# Patient Record
Sex: Female | Born: 1980 | Race: White | Hispanic: Yes | Marital: Single | Smoking: Never smoker
Health system: Southern US, Community
[De-identification: ages and names within clinical notes are randomized; demographics above are authoritative.]

## PROBLEM LIST (undated history)

## (undated) DIAGNOSIS — E039 Hypothyroidism, unspecified: Secondary | ICD-10-CM

## (undated) HISTORY — PX: NO PAST SURGERIES: SHX2092

---

## 2006-06-10 ENCOUNTER — Inpatient Hospital Stay (HOSPITAL_COMMUNITY): Admission: AD | Admit: 2006-06-10 | Discharge: 2006-06-10 | Payer: Self-pay | Admitting: Obstetrics & Gynecology

## 2006-06-10 IMAGING — US US OB TRANSVAGINAL MODIFY
1 series · 14 of 28 positions shown · non-contrast
Comparison: none

OBSTETRICAL ULTRASOUND:

 This ultrasound exam was performed in the [HOSPITAL] Ultrasound Department.  The OB US report was generated in the AS system, and faxed to the ordering physician.  This report is also available in [REDACTED] PACS.

[Series 1: us ob comp less 14 wks · 14 of 50 slices shown]
[im 2/50]
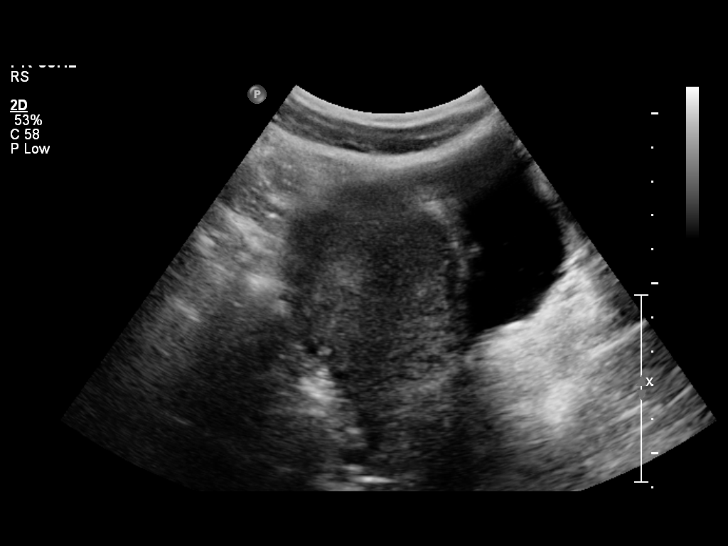
[im 6/50]
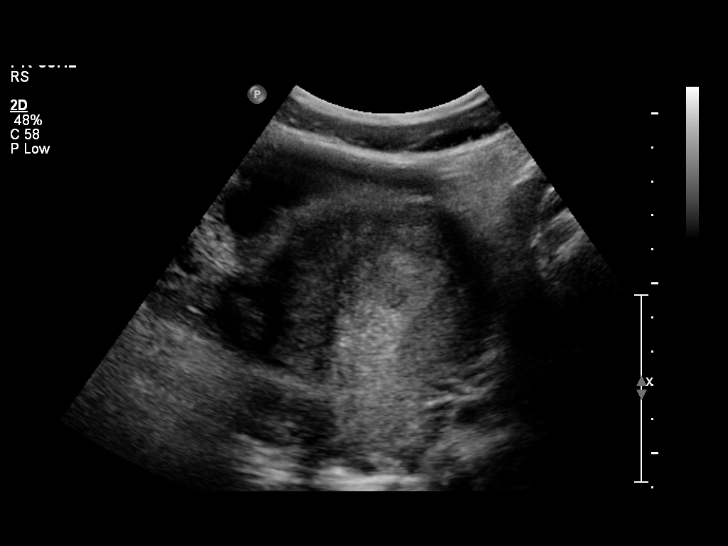
[im 10/50]
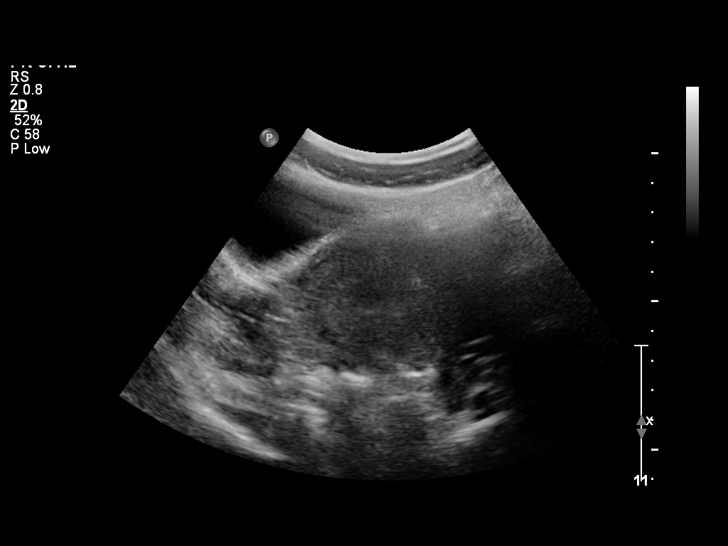
[im 13/50]
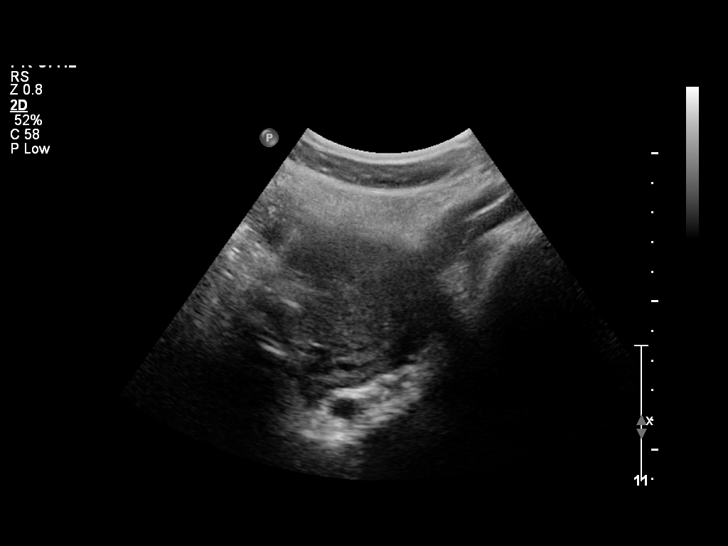
[im 17/50]
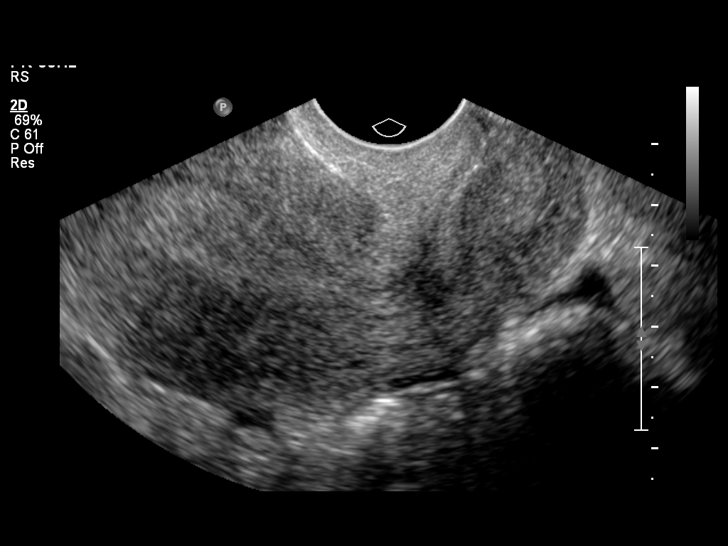
[im 20/50]
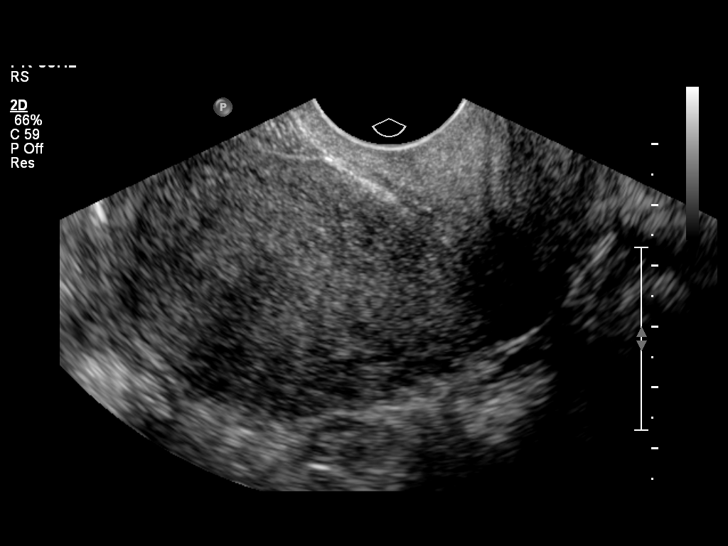
[im 24/50]
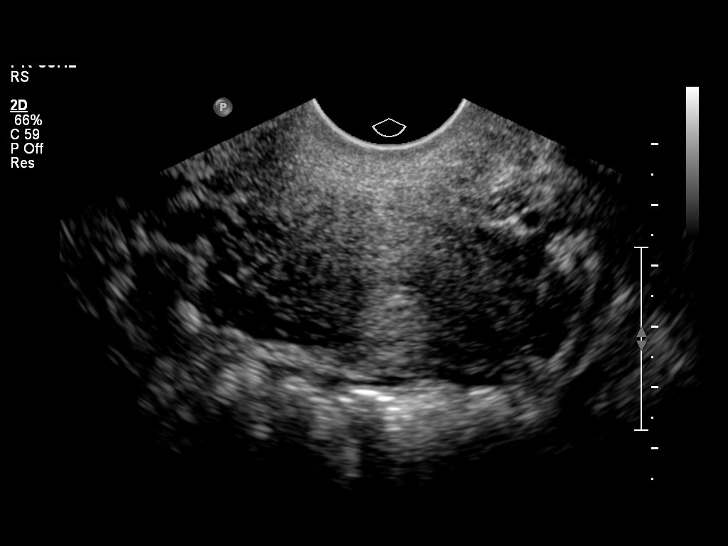
[im 28/50]
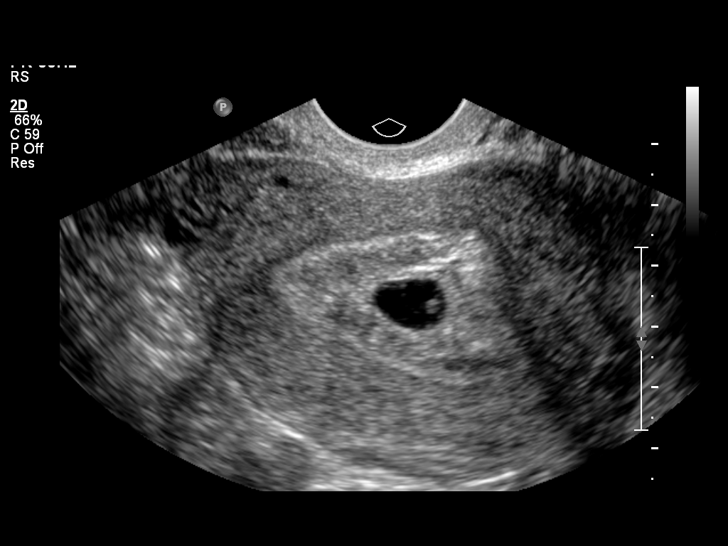
[im 31/50]
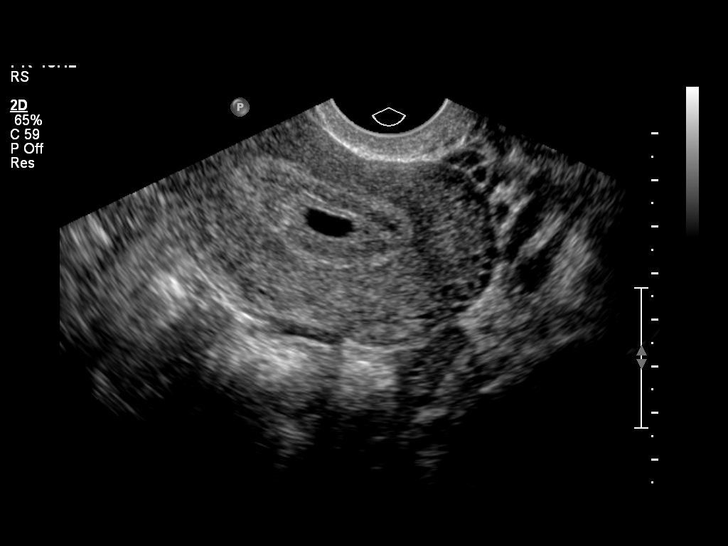
[im 35/50]
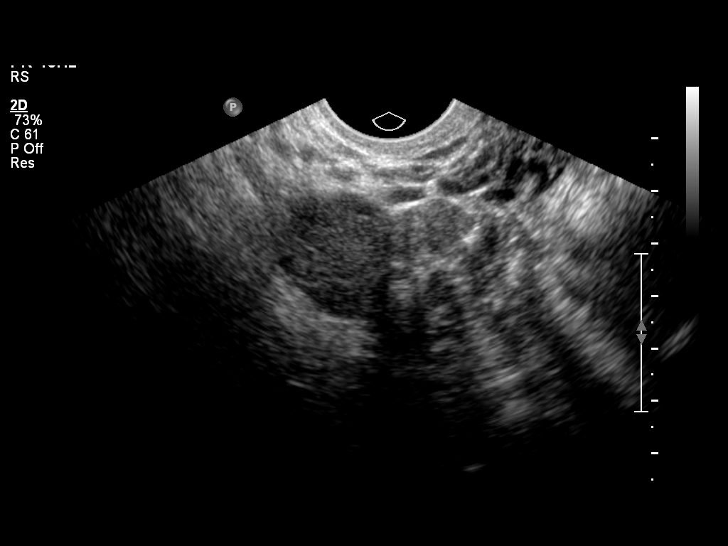
[im 39/50]
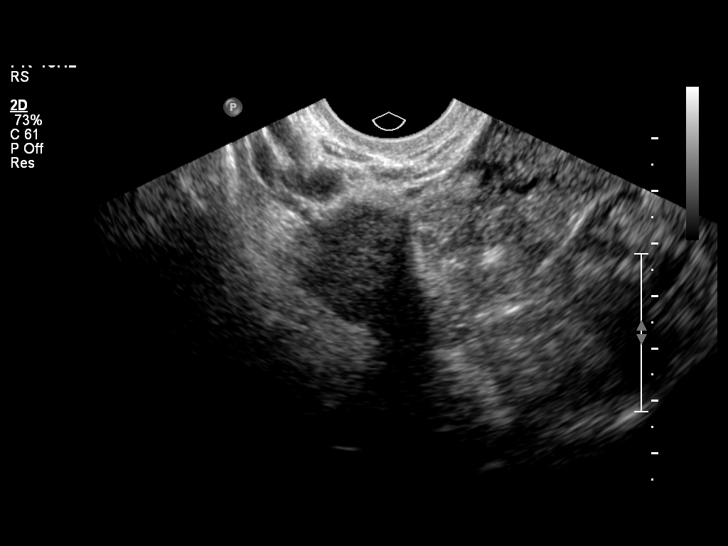
[im 42/50]
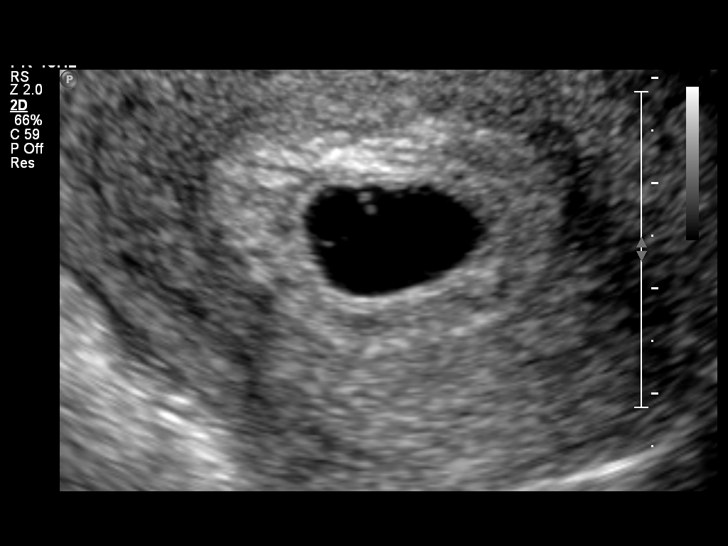
[im 46/50]
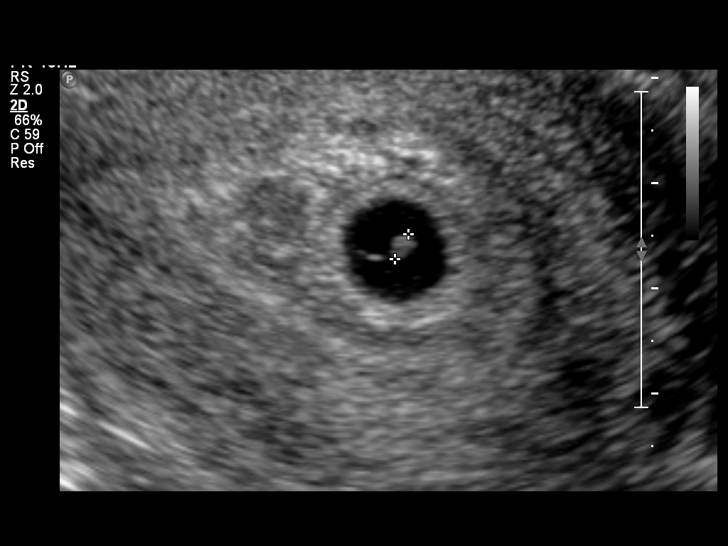
[im 50/50]
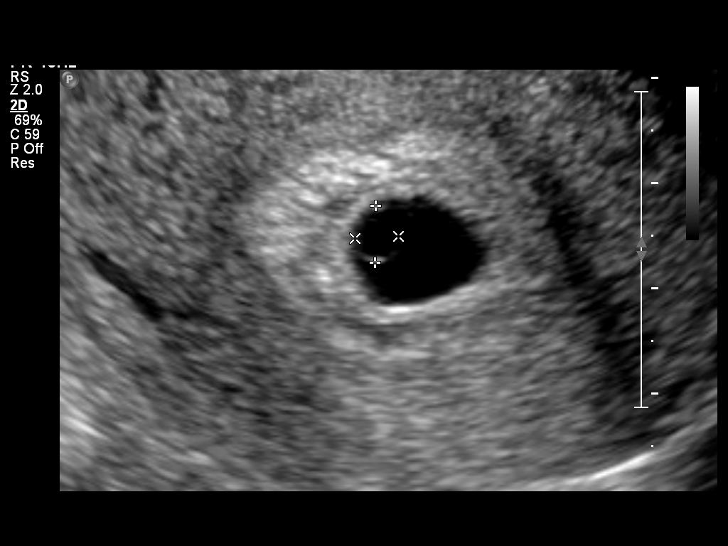

[14 of 28 positions shown; findings below may reference images not displayed]

IMPRESSION: See AS Obstetric US report.

## 2006-06-19 ENCOUNTER — Inpatient Hospital Stay (HOSPITAL_COMMUNITY): Admission: RE | Admit: 2006-06-19 | Discharge: 2006-06-19 | Payer: Self-pay

## 2006-06-19 IMAGING — US US OB TRANSVAGINAL
1 series · 14 of 26 positions shown · non-contrast
Comparison: none

OBSTETRICAL ULTRASOUND:

 This ultrasound exam was performed in the [HOSPITAL] Ultrasound Department.  The OB US report was generated in the AS system, and faxed to the ordering physician.  This report is also available in [REDACTED] PACS.

[Series 1: us ob transvaginal · 0.13mm/px · 14 of 26 slices shown]
[im 1/26]
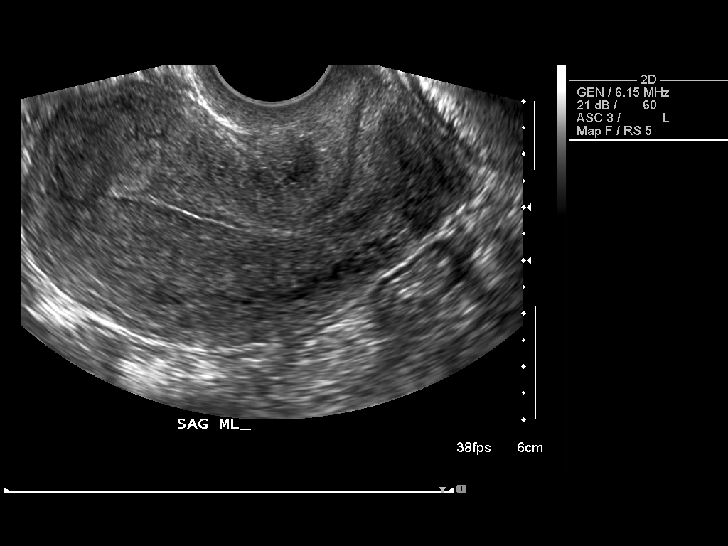
[im 3/26]
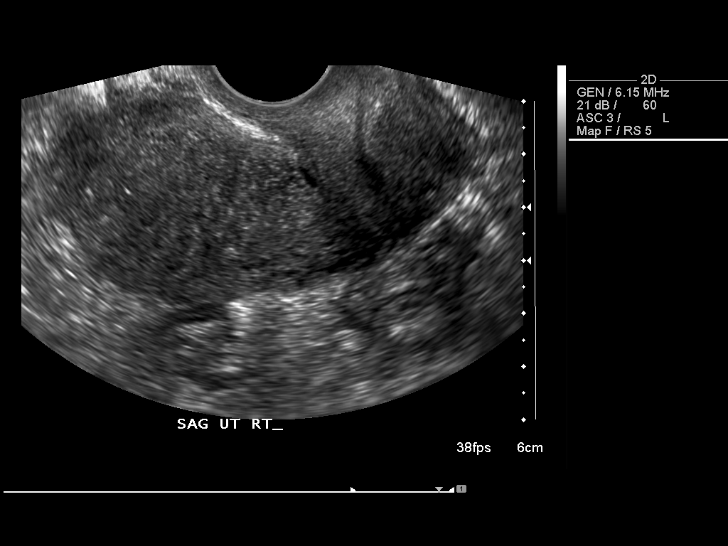
[im 5/26]
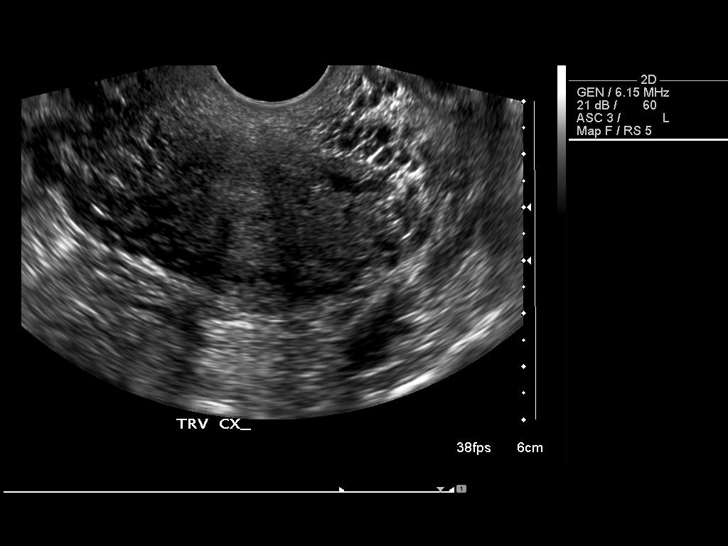
[im 7/26]
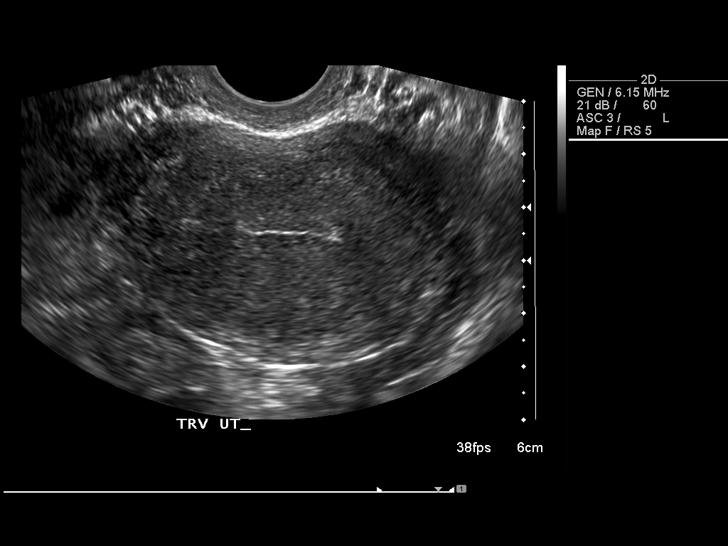
[im 9/26]
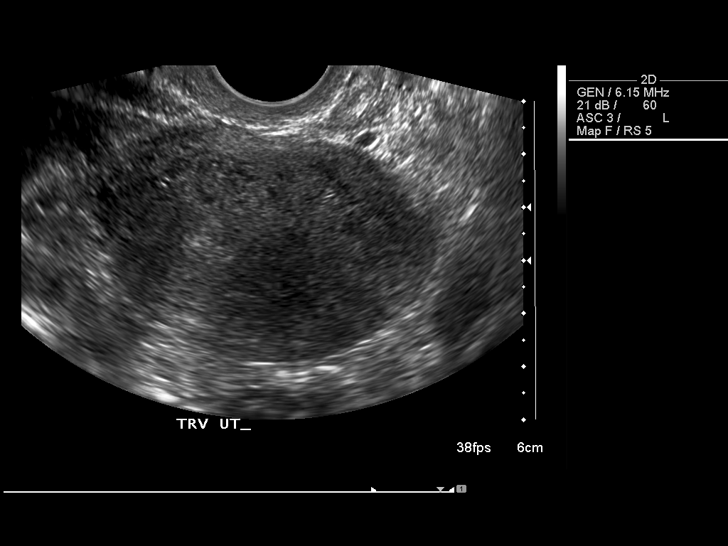
[im 11/26]
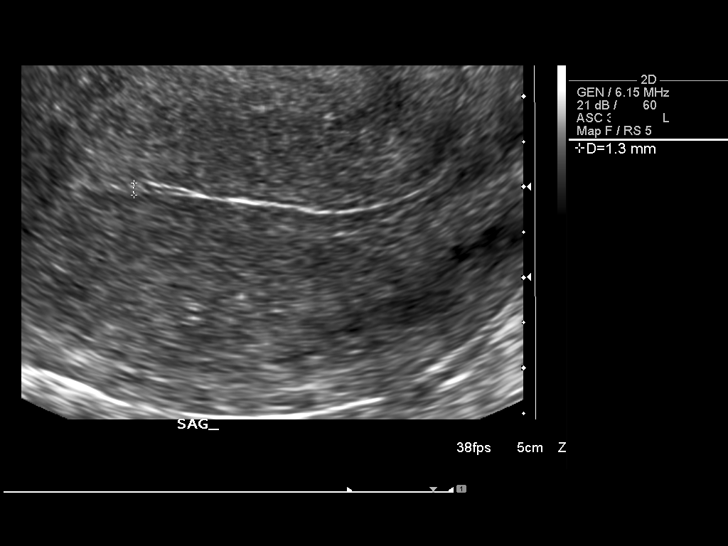
[im 13/26]
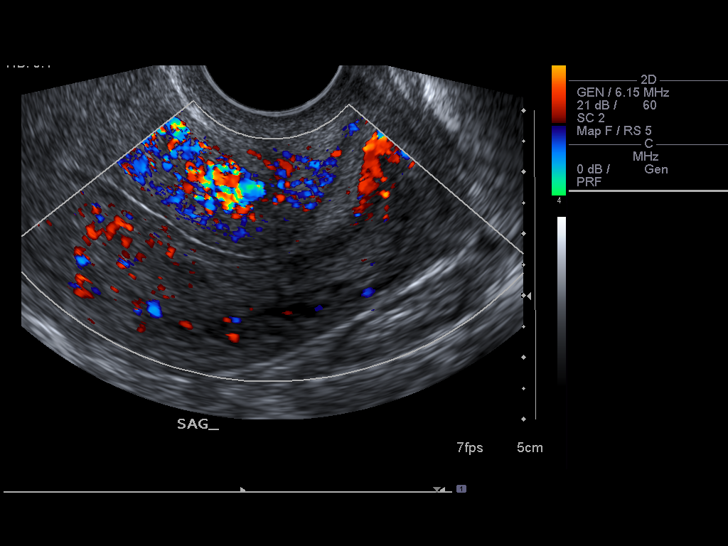
[im 14/26]
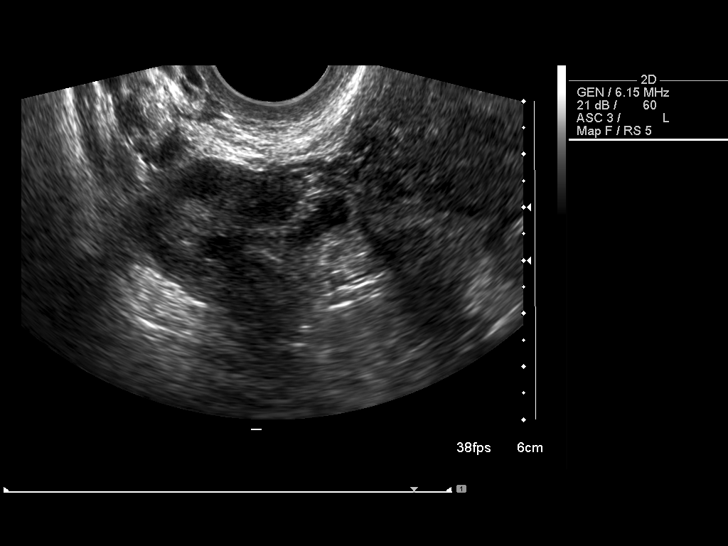
[im 16/26]
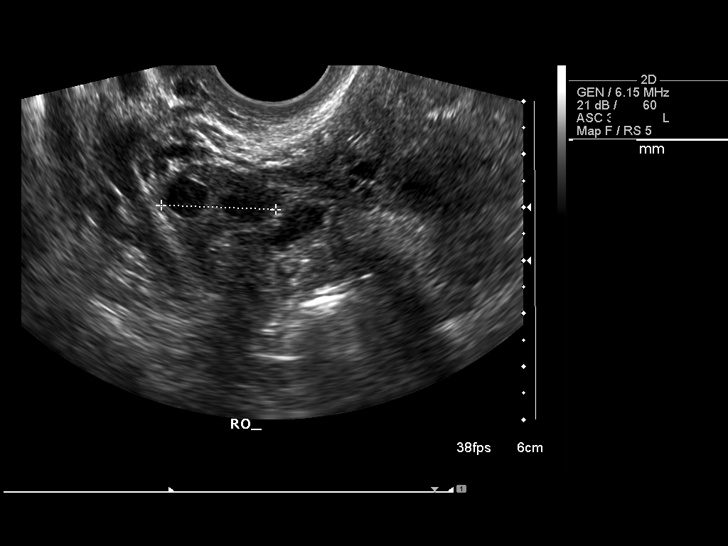
[im 18/26]
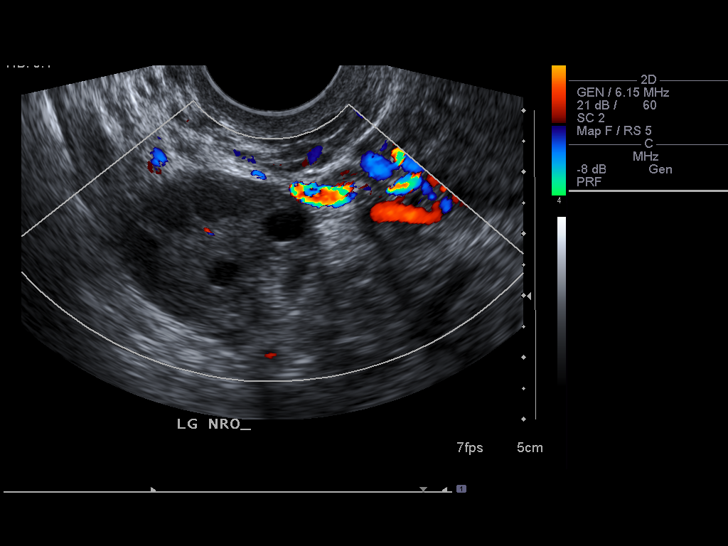
[im 20/26]
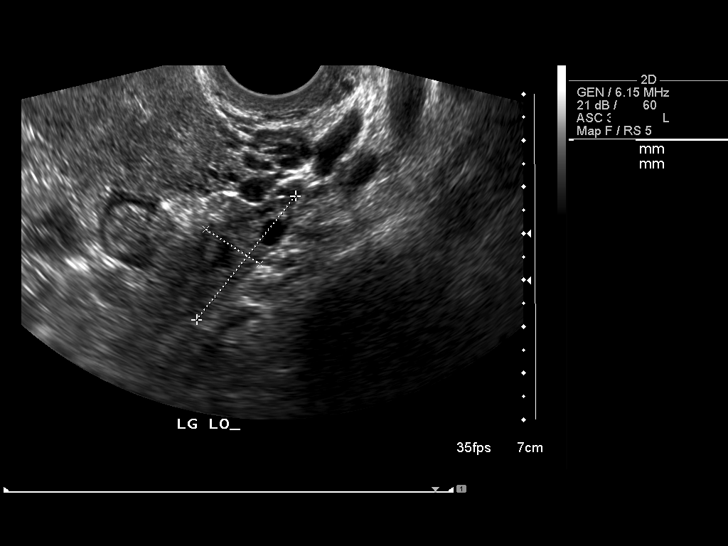
[im 22/26]
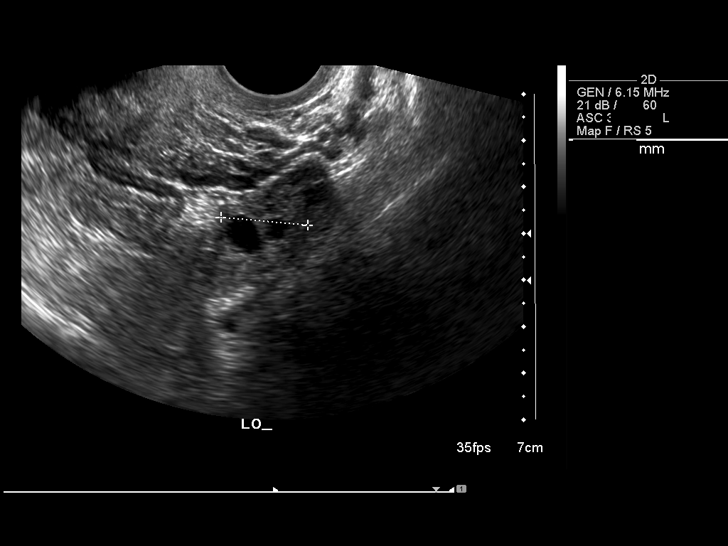
[im 24/26]
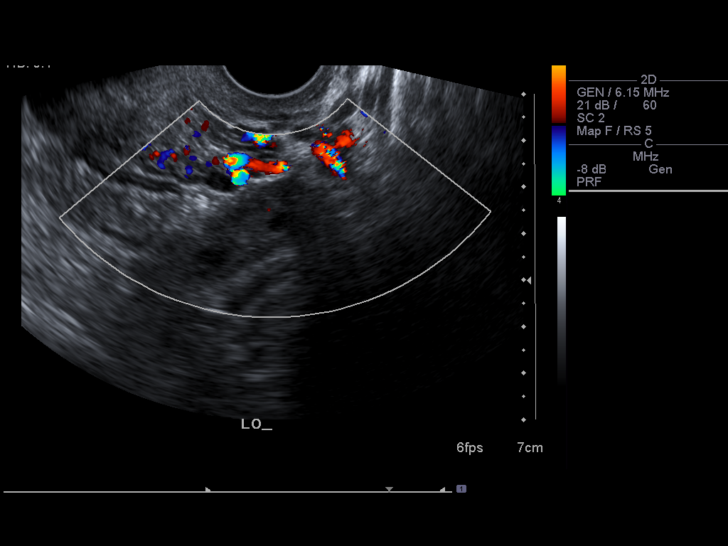
[im 26/26]
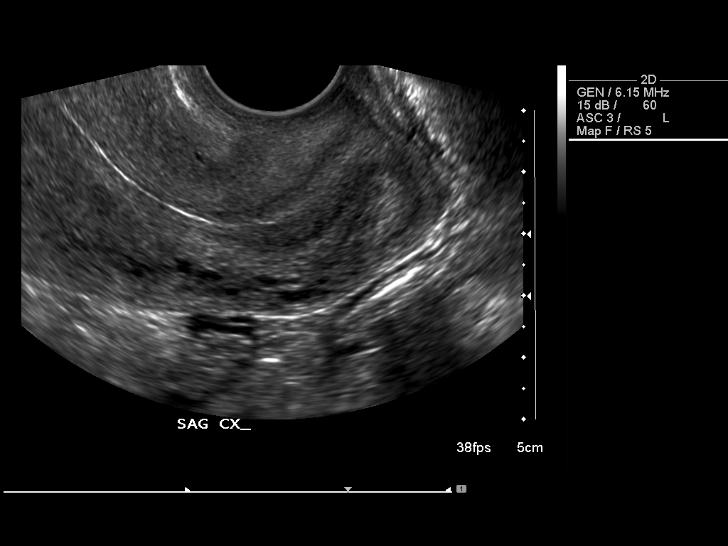

[14 of 26 positions shown; findings below may reference images not displayed]

IMPRESSION: See AS Obstetric US report.

## 2006-11-25 ENCOUNTER — Other Ambulatory Visit: Admission: RE | Admit: 2006-11-25 | Discharge: 2006-11-25 | Payer: Self-pay | Admitting: Family Medicine

## 2007-09-23 ENCOUNTER — Inpatient Hospital Stay (HOSPITAL_COMMUNITY): Admission: AD | Admit: 2007-09-23 | Discharge: 2007-09-24 | Payer: Self-pay | Admitting: Obstetrics and Gynecology

## 2010-12-27 LAB — CBC
Hemoglobin: 11.7 — ABNORMAL LOW
MCHC: 34.1
RBC: 3.53 — ABNORMAL LOW
WBC: 15.9 — ABNORMAL HIGH

## 2010-12-27 LAB — RPR: RPR Ser Ql: NONREACTIVE

## 2021-02-14 LAB — OB RESULTS CONSOLE RPR: RPR: NONREACTIVE

## 2021-02-14 LAB — OB RESULTS CONSOLE HIV ANTIBODY (ROUTINE TESTING): HIV: NONREACTIVE

## 2021-02-14 LAB — OB RESULTS CONSOLE HEPATITIS B SURFACE ANTIGEN: Hepatitis B Surface Ag: NEGATIVE

## 2021-02-14 LAB — HEPATITIS C ANTIBODY: HCV Ab: NEGATIVE

## 2021-04-01 NOTE — L&D Delivery Note (Signed)
Delivery Note At 1010 a viable female infant was delivered via SVD, presentation: LOA. APGAR: 9, 9; weight pending.   Placenta status: spontaneously delivered intact with gentle cord traction. Fundus firm with massage and Pitocin.   Anesthesia: local Lacerations: 2nd degree perineal Suture used for repair: 2-0 Vicryl rapide Est. Blood Loss (mL): 100 Placenta to home with mother Complications none Cord ph n/a   Mom to postpartum. Baby to Couplet care / Skin to Skin.    Julianne Handler, CNM 08/30/2021 10:47 AM

## 2021-08-30 ENCOUNTER — Encounter (HOSPITAL_COMMUNITY): Payer: Self-pay

## 2021-08-30 ENCOUNTER — Inpatient Hospital Stay (HOSPITAL_COMMUNITY)
Admission: AD | Admit: 2021-08-30 | Discharge: 2021-08-31 | DRG: 807 | Disposition: A | Discharge status: Home / Self Care | Attending: Obstetrics and Gynecology | Admitting: Obstetrics and Gynecology

## 2021-08-30 DIAGNOSIS — E039 Hypothyroidism, unspecified: Secondary | ICD-10-CM | POA: Diagnosis present

## 2021-08-30 DIAGNOSIS — O4292 Full-term premature rupture of membranes, unspecified as to length of time between rupture and onset of labor: Principal | ICD-10-CM | POA: Diagnosis present

## 2021-08-30 DIAGNOSIS — O99284 Endocrine, nutritional and metabolic diseases complicating childbirth: Secondary | ICD-10-CM | POA: Diagnosis present

## 2021-08-30 DIAGNOSIS — O26893 Other specified pregnancy related conditions, third trimester: Secondary | ICD-10-CM | POA: Diagnosis present

## 2021-08-30 DIAGNOSIS — Z3A38 38 weeks gestation of pregnancy: Secondary | ICD-10-CM

## 2021-08-30 HISTORY — DX: Hypothyroidism, unspecified: E03.9

## 2021-08-30 LAB — CBC
HCT: 36.1 % (ref 36.0–46.0)
Hemoglobin: 12.1 g/dL (ref 12.0–15.0)
MCH: 32.8 pg (ref 26.0–34.0)
MCHC: 33.5 g/dL (ref 30.0–36.0)
MCV: 97.8 fL (ref 80.0–100.0)
Platelets: 137 10*3/uL — ABNORMAL LOW (ref 150–400)
RBC: 3.69 MIL/uL — ABNORMAL LOW (ref 3.87–5.11)
RDW: 14.6 % (ref 11.5–15.5)
WBC: 11.6 10*3/uL — ABNORMAL HIGH (ref 4.0–10.5)
nRBC: 0 % (ref 0.0–0.2)

## 2021-08-30 LAB — TYPE AND SCREEN
ABO/RH(D): A POS
Antibody Screen: NEGATIVE

## 2021-08-30 LAB — POCT FERN TEST: POCT Fern Test: POSITIVE

## 2021-08-30 LAB — RPR: RPR Ser Ql: NONREACTIVE

## 2021-08-30 LAB — TSH: TSH: 2.19 u[IU]/mL (ref 0.350–4.500)

## 2021-08-30 LAB — OB RESULTS CONSOLE RUBELLA ANTIBODY, IGM: Rubella: IMMUNE

## 2021-08-30 MED ORDER — LACTATED RINGERS IV SOLN: INTRAVENOUS | Status: DC

## 2021-08-30 MED ORDER — IBUPROFEN 600 MG PO TABS
600.0000 mg | ORAL_TABLET | Freq: Four times a day (QID) | ORAL | Status: DC
  Administered 2021-08-30 – 2021-08-31 (×3): 600 mg via ORAL
  Filled 2021-08-30 (×4): qty 1

## 2021-08-30 MED ORDER — OXYTOCIN-SODIUM CHLORIDE 30-0.9 UT/500ML-% IV SOLN: 2.5000 [IU]/h | INTRAVENOUS | Status: DC

## 2021-08-30 MED ORDER — ONDANSETRON HCL 4 MG/2ML IJ SOLN: 4.0000 mg | INTRAMUSCULAR | Status: DC | PRN

## 2021-08-30 MED ORDER — ACETAMINOPHEN 325 MG PO TABS: 650.0000 mg | ORAL_TABLET | ORAL | Status: DC | PRN

## 2021-08-30 MED ORDER — OXYTOCIN-SODIUM CHLORIDE 30-0.9 UT/500ML-% IV SOLN
1.0000 m[IU]/min | INTRAVENOUS | Status: DC
  Administered 2021-08-30: 2 m[IU]/min via INTRAVENOUS
  Filled 2021-08-30: qty 500

## 2021-08-30 MED ORDER — SENNOSIDES-DOCUSATE SODIUM 8.6-50 MG PO TABS
2.0000 | ORAL_TABLET | ORAL | Status: DC
  Administered 2021-08-30: 2 via ORAL
  Filled 2021-08-30 (×2): qty 2

## 2021-08-30 MED ORDER — PRENATAL MULTIVITAMIN CH
1.0000 | ORAL_TABLET | Freq: Every day | ORAL | Status: DC
  Administered 2021-08-30 – 2021-08-31 (×2): 1 via ORAL
  Filled 2021-08-30 (×2): qty 1

## 2021-08-30 MED ORDER — LEVOTHYROXINE SODIUM 75 MCG PO TABS
125.0000 ug | ORAL_TABLET | Freq: Every day | ORAL | Status: DC
  Administered 2021-08-30: 125 ug via ORAL
  Filled 2021-08-30: qty 1

## 2021-08-30 MED ORDER — COCONUT OIL OIL: 1.0000 "application " | TOPICAL_OIL | Status: DC | PRN

## 2021-08-30 MED ORDER — FENTANYL CITRATE (PF) 100 MCG/2ML IJ SOLN: 50.0000 ug | INTRAMUSCULAR | Status: DC | PRN

## 2021-08-30 MED ORDER — DIPHENHYDRAMINE HCL 25 MG PO CAPS: 25.0000 mg | ORAL_CAPSULE | Freq: Four times a day (QID) | ORAL | Status: DC | PRN

## 2021-08-30 MED ORDER — ONDANSETRON HCL 4 MG/2ML IJ SOLN: 4.0000 mg | Freq: Four times a day (QID) | INTRAMUSCULAR | Status: DC | PRN

## 2021-08-30 MED ORDER — SOD CITRATE-CITRIC ACID 500-334 MG/5ML PO SOLN: 30.0000 mL | ORAL | Status: DC | PRN

## 2021-08-30 MED ORDER — BENZOCAINE-MENTHOL 20-0.5 % EX AERO
1.0000 "application " | INHALATION_SPRAY | CUTANEOUS | Status: DC | PRN
  Administered 2021-08-30: 1 via TOPICAL
  Filled 2021-08-30: qty 56

## 2021-08-30 MED ORDER — TETANUS-DIPHTH-ACELL PERTUSSIS 5-2.5-18.5 LF-MCG/0.5 IM SUSY: 0.5000 mL | PREFILLED_SYRINGE | Freq: Once | INTRAMUSCULAR | Status: DC

## 2021-08-30 MED ORDER — WITCH HAZEL-GLYCERIN EX PADS: 1.0000 "application " | MEDICATED_PAD | CUTANEOUS | Status: DC | PRN

## 2021-08-30 MED ORDER — ONDANSETRON HCL 4 MG PO TABS: 4.0000 mg | ORAL_TABLET | ORAL | Status: DC | PRN

## 2021-08-30 MED ORDER — OXYTOCIN BOLUS FROM INFUSION
333.0000 mL | Freq: Once | INTRAVENOUS | Status: AC
  Administered 2021-08-30: 333 mL via INTRAVENOUS

## 2021-08-30 MED ORDER — LEVOTHYROXINE SODIUM 100 MCG PO TABS
100.0000 ug | ORAL_TABLET | Freq: Every day | ORAL | Status: DC
  Administered 2021-08-31: 100 ug via ORAL
  Filled 2021-08-30: qty 1

## 2021-08-30 MED ORDER — OXYCODONE-ACETAMINOPHEN 5-325 MG PO TABS: 1.0000 | ORAL_TABLET | ORAL | Status: DC | PRN

## 2021-08-30 MED ORDER — LACTATED RINGERS IV SOLN: 500.0000 mL | INTRAVENOUS | Status: DC | PRN

## 2021-08-30 MED ORDER — OXYCODONE-ACETAMINOPHEN 5-325 MG PO TABS: 2.0000 | ORAL_TABLET | ORAL | Status: DC | PRN

## 2021-08-30 MED ORDER — MEASLES, MUMPS & RUBELLA VAC IJ SOLR: 0.5000 mL | Freq: Once | INTRAMUSCULAR | Status: DC

## 2021-08-30 MED ORDER — SIMETHICONE 80 MG PO CHEW: 80.0000 mg | CHEWABLE_TABLET | ORAL | Status: DC | PRN

## 2021-08-30 MED ORDER — DIBUCAINE (PERIANAL) 1 % EX OINT: 1.0000 "application " | TOPICAL_OINTMENT | CUTANEOUS | Status: DC | PRN

## 2021-08-30 MED ORDER — LIDOCAINE HCL (PF) 1 % IJ SOLN
30.0000 mL | INTRAMUSCULAR | Status: AC | PRN
  Administered 2021-08-30: 30 mL via SUBCUTANEOUS
  Filled 2021-08-30: qty 30

## 2021-08-30 MED ORDER — TRANEXAMIC ACID-NACL 1000-0.7 MG/100ML-% IV SOLN
1000.0000 mg | INTRAVENOUS | Status: AC
  Administered 2021-08-30: 1000 mg via INTRAVENOUS
  Filled 2021-08-30: qty 100

## 2021-08-30 MED ORDER — TERBUTALINE SULFATE 1 MG/ML IJ SOLN: 0.2500 mg | Freq: Once | INTRAMUSCULAR | Status: DC | PRN

## 2021-08-30 NOTE — Lactation Note (Signed)
This note was copied from a baby's chart. Lactation Consultation Note  Patient Name: Girl Alexee Delsanto DPOEU'M Date: 08/30/2021 Reason for consult: Initial assessment;Early term 37-38.6wks;Maternal endocrine disorder Age:40 hours P4, ETI female infant. Per mom, infant had 3 voids and one stool since birth. Infant has breastfeed 3 times since delivery for 15 to 20 minutes, 1049 am, 1500 pm, 1750 pm . Mom is experienced at breast feeding see maternal data below and mom had Sonata Medela DEBP at home.  Mom feels breastfeeding is going well she has no questions or concerns for LC at this time. LC did not observe latch at this time, per mom, infant BF at 1740 for 20 minutes, infant was not cuing to feed and mom was changing stool diaper while LC was in the room. Mom will continue to breastfeed infant according to hunger cues, on demand, 8 to 12 times within 24 hours, skin to skin. Mom will continue to latch infant on both breast during a feeding, if infant is still cuing to breastfeed.  Mom knows to call RN/LC if she needs latch assistance. Mom made aware of O/P services, breastfeeding support groups, community resources, and our phone # for post-discharge questions.    Maternal Data Has patient been taught Hand Expression?: Yes Does the patient have breastfeeding experience prior to this delivery?: Yes How long did the patient breastfeed?: Per mom, she BF st child for 3 months and 2nd& 3rd child for 8 months each, her 3rd child is now 71 years old.  Feeding Mother's Current Feeding Choice: Breast Milk  LATCH Score                    Lactation Tools Discussed/Used    Interventions Interventions: Breast feeding basics reviewed;Skin to skin;Hand express;Position options;DEBP;Education;LC Services brochure  Discharge Pump: Personal (Per mom, she has Sonata Medela DEBP at home.)  Consult Status Consult Status: Follow-up Date: 08/31/21 Follow-up type: In-patient    Danelle Earthly 08/30/2021, 7:10 PM

## 2021-08-30 NOTE — Progress Notes (Signed)
Labor Progress Note Marisa Haas is a 41 y.o. [id number] at [redacted]w[redacted]d who presented for SROM.   S: Doing well. Continues to feel intermittent contractions. No concerns.   O:  BP 97/65   Pulse 86   Temp 98.3 F (36.8 C) (Oral)   Resp (!) 22   Ht 5\' 5"  (1.651 m)   Wt 77.6 kg   SpO2 100%   BMI 28.46 kg/m   EFM: Baseline 135 bpm, moderate variability, + accels, no decels Toco: Every 2-5 minutes   CVE: Dilation: 4 Effacement (%): 80 Station: -2 Presentation: Vertex Exam by:: Amily Depp, MD  A&P: 41 y.o. 24 [redacted]w[redacted]d   #Labor: SVE unchanged from prior. Will start Pitocin 2x2 and reassess in 4 hours, sooner as needed.  #Pain: Maternal support; coping well #FWB: Cat 1  #GBS negative  [redacted]w[redacted]d, MD 7:31 AM

## 2021-08-30 NOTE — Progress Notes (Signed)
Labor Progress Note Marisa Haas is a 41 y.o. [id number] at [redacted]w[redacted]d presented for PROM at term  S:  Feeling more uncomfortable with ctx, breathing heavy with each one. Requesting to get in shower.  O:  BP 107/62   Pulse 82   Temp 98.2 F (36.8 C) (Oral)   Resp (!) 22   Ht 5\' 5"  (1.651 m)   Wt 77.6 kg   SpO2 100%   BMI 28.46 kg/m  EFM: baseline 130 bpm/ mod variability/ + accels/ no decels  Toco/IUPC: q2 SVE: Dilation: 7 Effacement (%): 90 Station: -1 Presentation: Vertex Exam by:: [email or web address], CNM Pitocin: 4 mu/min  A/P: 41 y.o. 24 [redacted]w[redacted]d  1. Labor: active 2. FWB: Cat I 3. Pain: analgesia/anesthesia/NO prn   Now in active labor, will d/c Pitocin so pt can get in shower. Anticipate labor progress and SVD.  [redacted]w[redacted]d, CNM 9:33 AM
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
## 2021-08-30 NOTE — Discharge Summary (Signed)
Postpartum Discharge Summary     Patient Name: Marisa Haas DOB: 08/09/1980 MRN: [id number]  Date of admission: 08/30/2021 Delivery date:08/30/2021  Delivering provider: Julianne Handler  Date of discharge: 08/31/2021  Admitting diagnosis: Normal labor [O80, Z37.9] Intrauterine pregnancy: [redacted]w[redacted]d    Secondary diagnosis:  Principal Problem:   Normal labor Active Problems:   SVD (spontaneous vaginal delivery)  Additional problems: AMA, Hypothyroidism    Discharge diagnosis: Term Pregnancy Delivered                                              Post partum procedures: none Augmentation: Pitocin Complications: None  Hospital course: Onset of Labor With Vaginal Delivery      41y.o. yo [id number] 346w3das admitted in Latent Labor on 08/30/2021. Patient had an uncomplicated labor course as follows:  Membrane Rupture Time/Date: 1:30 AM ,08/30/2021   Delivery Method:Vaginal, Spontaneous  Episiotomy: None  Lacerations:  2nd degree  Patient had an uncomplicated postpartum course.  She is ambulating, tolerating a regular diet, passing flatus, and urinating well. Patient is discharged home in stable condition on 08/31/21.  Newborn Data: Birth date:08/30/2021  Birth time:10:10 AM  Gender:Female  Living status:Living  Apgars:9 ,9  Weight:3160 g   Magnesium Sulfate received: No BMZ received: No Rhophylac:N/A MMR:N/A T-DaP: record unavailable Flu: N/A Transfusion:No  Physical exam  Vitals:   08/30/21 1730 08/30/21 2121 08/31/21 0130 08/31/21 0511  BP: 109/66 98/68 (!) 90/56 (!) 98/58  Pulse: 70 76 60 67  Resp: _0 Temp: 98.1 F (36.7 C) 98.2 F (36.8 C) 98.2 F (36.8 C) 98.3 F (36.8 C)  TempSrc: Oral Oral Oral Oral  SpO2: 100% 99% 100% 100%  Weight:      Height:       General: alert, cooperative, and no distress Lochia: appropriate Uterine Fundus: firm Incision: N/A DVT Evaluation: No evidence of DVT seen on physical exam. Labs: Lab Results  Component Value  Date   WBC 11.6 (H) 08/30/2021   HGB 12.1 08/30/2021   HCT 36.1 08/30/2021   MCV 97.8 08/30/2021   PLT 137 (L) 08/30/2021       View : No data to display.         Edinburgh Score:    08/31/2021    7:30 AM  Edinburgh Postnatal Depression Scale Screening Tool  I have been able to laugh and see the funny side of things. 0  I have looked forward with enjoyment to things. 0  I have blamed myself unnecessarily when things went wrong. 1  I have been anxious or worried for no good reason. 0  I have felt scared or panicky for no good reason. 0  Things have been getting on top of me. 1  I have been so unhappy that I have had difficulty sleeping. 0  I have felt sad or miserable. 1  I have been so unhappy that I have been crying. 1  The thought of harming myself has occurred to me. 0  Edinburgh Postnatal Depression Scale Total 4     After visit meds:  Allergies as of 08/31/2021       Reactions   Iodine    Shellfish Allergy         Medication List     TAKE these medications    ferrous sulfate 324 MG  Tbec Take 324 mg by mouth.   ibuprofen 600 MG tablet Commonly known as: ADVIL Take 1 tablet (600 mg total) by mouth every 6 (six) hours.   levothyroxine 100 MCG tablet Commonly known as: SYNTHROID Take 100 mcg by mouth daily before breakfast.   prenatal multivitamin Tabs tablet Take 1 tablet by mouth daily at 12 noon.         Discharge home in stable condition Infant Feeding: Breast Infant Disposition:home with mother Discharge instruction: per After Visit Summary and Postpartum booklet. Activity: Advance as tolerated. Pelvic rest for 6 weeks.  Diet: routine diet Future Appointments:No future appointments. Follow up Visit: [facility] in 6 weeks- pt to schedule    08/31/2021 Hansel Feinstein, CNM

## 2021-08-31 MED ORDER — IBUPROFEN 600 MG PO TABS: 600.0000 mg | ORAL_TABLET | Freq: Four times a day (QID) | ORAL | 0 refills | Status: AC

## 2021-08-31 NOTE — Lactation Note (Signed)
This note was copied from a baby's chart. Lactation Consultation Note  Patient Name: Marisa Haas Date: 08/31/2021 Reason for consult: Follow-up assessment Age:40 hours  P4, Mother denies questions or concerns. Reviewed engorgement care and monitoring voids/stools.  Feeding Mother's Current Feeding Choice: Breast Milk  Interventions Interventions: Education  Discharge Discharge Education: Engorgement and breast care;Warning signs for feeding baby  Consult Status Consult Status: Complete Date: 08/31/21    Dahlia Byes [facility] 08/31/2021, 12:15 PM
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
## 2021-09-10 ENCOUNTER — Telehealth (HOSPITAL_COMMUNITY): Payer: Self-pay

## 2021-09-10 NOTE — Telephone Encounter (Signed)
"  Doing pretty well." Patient declines questions or concerns about her healing.  "She is sleeping well. The doctor was pleased with her being back up to birthweight. Her cord fell off. She sleeps in a bassinet." RN reviewed ABC's of safe sleep with patient. Patient declines any questions or concerns about baby.  EPDS score is 0.  Marcelino Duster [facility] 06/12//2023,1516

## 2022-06-15 ENCOUNTER — Emergency Department (HOSPITAL_COMMUNITY)

## 2022-06-15 ENCOUNTER — Emergency Department (HOSPITAL_COMMUNITY)
Admission: EM | Admit: 2022-06-15 | Discharge: 2022-06-15 | Disposition: A | Discharge status: Home / Self Care | Payer: No Typology Code available for payment source | Attending: Emergency Medicine | Admitting: Emergency Medicine

## 2022-06-15 ENCOUNTER — Other Ambulatory Visit: Payer: Self-pay

## 2022-06-15 ENCOUNTER — Encounter (HOSPITAL_COMMUNITY): Payer: Self-pay | Admitting: Emergency Medicine

## 2022-06-15 DIAGNOSIS — Y92410 Unspecified street and highway as the place of occurrence of the external cause: Secondary | ICD-10-CM | POA: Diagnosis not present

## 2022-06-15 DIAGNOSIS — E039 Hypothyroidism, unspecified: Secondary | ICD-10-CM | POA: Insufficient documentation

## 2022-06-15 DIAGNOSIS — S0003XA Contusion of scalp, initial encounter: Secondary | ICD-10-CM | POA: Insufficient documentation

## 2022-06-15 DIAGNOSIS — R519 Headache, unspecified: Secondary | ICD-10-CM | POA: Diagnosis present
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
## 2022-06-17 ENCOUNTER — Encounter: Payer: Self-pay | Admitting: Physical Medicine and Rehabilitation

## 2022-06-17 ENCOUNTER — Ambulatory Visit (INDEPENDENT_AMBULATORY_CARE_PROVIDER_SITE_OTHER): Payer: Medicaid Other | Admitting: Physical Medicine and Rehabilitation

## 2022-06-17 ENCOUNTER — Other Ambulatory Visit (INDEPENDENT_AMBULATORY_CARE_PROVIDER_SITE_OTHER)

## 2022-06-17 DIAGNOSIS — M7918 Myalgia, other site: Secondary | ICD-10-CM | POA: Diagnosis not present

## 2022-06-17 DIAGNOSIS — M542 Cervicalgia: Secondary | ICD-10-CM

## 2022-06-17 DIAGNOSIS — M25512 Pain in left shoulder: Secondary | ICD-10-CM

## 2022-06-17 DIAGNOSIS — S161XXA Strain of muscle, fascia and tendon at neck level, initial encounter: Secondary | ICD-10-CM | POA: Diagnosis not present

## 2022-06-17 NOTE — Progress Notes (Unsigned)
Functional Pain Scale - descriptive words and definitions  Intense (8)    Cannot complete any ADLs without much assistance/cannot concentrate/conversation is difficult/unable to sleep and unable to use distraction. Severe range order  Average Pain 9  Neck pain on the left side that radiates into the left shoulder. Hard to turn head
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
## 2022-06-20 ENCOUNTER — Other Ambulatory Visit: Payer: Self-pay

## 2022-06-20 ENCOUNTER — Encounter: Payer: Self-pay | Admitting: Physical Therapy

## 2022-06-20 ENCOUNTER — Telehealth: Payer: Self-pay | Admitting: Surgical

## 2022-06-20 ENCOUNTER — Ambulatory Visit: Attending: Physical Medicine and Rehabilitation | Admitting: Physical Therapy

## 2022-06-20 DIAGNOSIS — M25512 Pain in left shoulder: Secondary | ICD-10-CM | POA: Diagnosis not present

## 2022-06-20 DIAGNOSIS — S161XXA Strain of muscle, fascia and tendon at neck level, initial encounter: Secondary | ICD-10-CM | POA: Insufficient documentation

## 2022-06-20 DIAGNOSIS — M6281 Muscle weakness (generalized): Secondary | ICD-10-CM | POA: Insufficient documentation

## 2022-06-20 DIAGNOSIS — M542 Cervicalgia: Secondary | ICD-10-CM | POA: Diagnosis not present

## 2022-06-20 DIAGNOSIS — M7918 Myalgia, other site: Secondary | ICD-10-CM | POA: Insufficient documentation
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
## 2022-06-20 NOTE — Telephone Encounter (Signed)
Pt called requesting a out of work note. Please call when ready for pick up. Pt phone number is [phone].

## 2022-07-01 ENCOUNTER — Other Ambulatory Visit: Payer: Self-pay | Admitting: Physical Medicine and Rehabilitation

## 2022-07-01 ENCOUNTER — Telehealth: Payer: Self-pay | Admitting: Physical Medicine and Rehabilitation

## 2022-07-01 NOTE — Progress Notes (Signed)
Spoke with patient via telephone today, she is requesting note for work. She has attended formal physical therapy recently and has sessions scheduled for the upcoming weeks. I did provide her with work excuse until PT is completed. She has 6 follow up with [place], we will re-evaluate then.

## 2022-07-01 NOTE — Telephone Encounter (Signed)
Patient called in stating she is unable to do her job because of the pain and she would like a call explaining her job duties and see if she can get a note written for her

## 2022-07-03 ENCOUNTER — Ambulatory Visit: Attending: Physical Medicine and Rehabilitation | Admitting: Physical Therapy

## 2022-07-03 ENCOUNTER — Encounter: Payer: Self-pay | Admitting: Physical Therapy

## 2022-07-03 DIAGNOSIS — M542 Cervicalgia: Secondary | ICD-10-CM | POA: Diagnosis present

## 2022-07-03 DIAGNOSIS — M6281 Muscle weakness (generalized): Secondary | ICD-10-CM | POA: Diagnosis present

## 2022-07-03 NOTE — Therapy (Signed)
OUTPATIENT PHYSICAL THERAPY TREATMENT NOTE   Patient Name: Marisa Haas MRN: [id number] DOB:1981/03/10, 42 y.o., female Today's Date: 07/03/2022  PCP: [facility]   REFERRING PROVIDER: Lorine Bears, NP   PT End of Session - 07/03/22 1223     Visit Number 2    Number of Visits --   1-2x/week   Date for PT Re-Evaluation 08/15/22    Authorization Type [employer] MCD - NDI    PT Start Time 1222   pt arrived late   PT Stop Time 1300    PT Time Calculation (min) 38 min             Past Medical History:  Diagnosis Date   Hypothyroidism    Past Surgical History:  Procedure Laterality Date   NO PAST SURGERIES     Patient Active Problem List   Diagnosis Date Noted   Normal labor 08/30/2021   SVD (spontaneous vaginal delivery) 08/30/2021    THERAPY DIAG:  Cervicalgia  Muscle weakness   Rationale for Evaluation and Treatment Rehabilitation  REFERRING DIAG: Cervicalgia [M54.2], Acute pain of left shoulder [M25.512], Myofascial pain syndrome [M79.18], Acute strain of neck muscle, initial encounter [S16.1XXA]   PERTINENT HISTORY: none  PRECAUTIONS/RESTRICTIONS:   none  SUBJECTIVE:  Pt reports that she feels like things are slightly improved since last visit.  She does not feel that the k-tape was helpful, but does feel that TDN made a positive difference.  Pain:  Are you having pain? Yes Pain location: L>R neck pain into L medial scapula and L UT NPRS scale:  10/10 to 10/10 Aggravating factors: rotating to L, neck movements Relieving factors: hot shower does help Pain description: sharp, burning, and aching Stage: Acute Stability: staying the same 24 hour pattern: worse with activity   OBJECTIVE: (objective measures completed at initial evaluation unless otherwise dated)  OBJECTIVE:    DIAGNOSTIC FINDINGS:  CT clear   GENERAL OBSERVATION:          Hesitant to move                               SENSATION:          Light touch: Appears  intact           PALPATION: Exquisite TTP L UT, periscapular, L Cx spine   Cervical ROM   ROM ROM  06/20/2022  Flexion 10  Extension 4  Right lateral flexion 5  Left lateral flexion 3  Right rotation 3  Left rotation 4  Flexion rotation (normal is 30 degrees)    Flexion rotation (normal is 30 degrees)      (Blank rows = not tested, N = WNL, * = concordant pain)   UPPER EXTREMITY MMT:   MMT Right 06/20/2022 Left 06/20/2022  Shoulder flexion WNL Unable to test strength d/t pain  Shoulder abduction (C5)      Shoulder ER      Shoulder IR      Middle trapezius      Lower trapezius      Shoulder extension      Grip strength      Shoulder shrug (C4)      Elbow flexion (C6)      Elbow ext (C7)      Thumb ext (C8)      Finger abd (T1)      Grossly        (Blank rows = not  tested, score listed is out of 5 possible points.  N = WNL, D = diminished, C = clear for gross weakness with myotome testing, * = concordant pain with testing)       PATIENT SURVEYS:  NDI 37/50                             PATIENT EDUCATION:  POC, diagnosis, prognosis, HEP, and outcome measures.  Pt educated via explanation, demonstration, and handout (HEP).  Pt confirms understanding verbally.             HOME EXERCISE PROGRAM: None issued, encouraged heat   Treatment priorities     Eval (06/20/2022)              TDN              Manual for ROM                                                              TREATMENT 4/3:  Manual Therapy: - Manual therapy: Skilled palpation to identify trigger points for TDN STM to all listed muscles following TDN K-tape bil UT and L periscapular region   Trigger Point Dry-Needling  Treatment instructions: Expect mild to moderate muscle soreness. S/S of pneumothorax if dry needled over a lung field, and to seek immediate medical attention should they occur. Patient verbalized understanding of these instructions and education.   Patient Consent Given:  Yes Education handout provided: No Muscles treated: R sub occipital Electrical stimulation performed: No Parameters: N/A Treatment response/outcome: pain reduction    ASSESSMENT:   CLINICAL IMPRESSION: Pt still in high levels of pain and unable to tolerate exercise.  Proceeded with TDN which was better tolerated than last session.  Neck ROM still significantly limited but rotation improved today compared to last visit.     OBJECTIVE IMPAIRMENTS: Pain, neck ROM, UE strength limited d/t pain   ACTIVITY LIMITATIONS: lifting, caring for child, work, housework   PERSONAL FACTORS: See medical history and pertinent history     REHAB POTENTIAL: Good   CLINICAL DECISION MAKING: Stable/uncomplicated   EVALUATION COMPLEXITY: Low     GOALS:     SHORT TERM GOALS: Target date: 07/18/2022   Marisa Haas will be >75% HEP compliant to improve carryover between sessions and facilitate independent management of condition   Evaluation (06/20/2022): ongoing Goal status: INITIAL     LONG TERM GOALS: Target date: 08/15/2022   Marisa Haas will show a >/= 17 pt improvement in their NDI score (MCID 7.5 pts) as a proxy for functional improvement   Evaluation/Baseline (06/20/2022): 37/50 pts Goal status: INITIAL     2.  Marisa Haas will self report >/= 50% decrease in pain from evaluation    Evaluation/Baseline (06/20/2022): 10/10 max pain Goal status: INITIAL     3.  Marisa Haas will be able to care for child including lifting and holding for prolonged perios, not limited by pain   Evaluation/Baseline (06/20/2022): limited Goal status: INITIAL     4.  Marisa Haas will be able to return to work, not limited by pain   Evaluation/Baseline (06/20/2022): limited Goal status: INITIAL     5.  Marisa Haas will improve the following MMTs to >/= 4/5 to show improvement in strength:  shoulder flexion    Evaluation/Baseline (06/20/2022): unable to complete d/t pain Goal status: INITIAL       PLAN: PT FREQUENCY:  1-2x/week   PT DURATION: 8 weeks (Ending 08/15/2022)   PLANNED INTERVENTIONS: Therapeutic exercises, Aquatic therapy, Therapeutic activity, Neuro Muscular re-education, Gait training, Patient/Family education, Joint mobilization, Dry Needling, Electrical stimulation, Spinal mobilization and/or manipulation, Moist heat, Taping, Vasopneumatic device, Ionotophoresis 4mg /ml Dexamethasone, and Manual therapy   Marisa Haas PT 07/03/2022, 1:31 PM

## 2022-07-04 NOTE — Therapy (Signed)
OUTPATIENT PHYSICAL THERAPY TREATMENT NOTE   Patient Name: Marisa Haas MRN: [id number] DOB:12/29/1980, 42 y.o., female Today's Date: 07/05/2022  [facility]   REFERRING PROVIDER: Juanda ChanceWilliams, Megan E, NP   PT End of Session - 07/05/22 1053     Visit Number 3    Date for PT Re-Evaluation 08/15/22    Authorization Type [employer] MCD - NDI    PT Start Time 1050    PT Stop Time 1130    PT Time Calculation (min) 40 min    Activity Tolerance Patient limited by pain    [facility] assessed/performed;Anxious              Past Medical History:  Diagnosis Date   Hypothyroidism    Past Surgical History:  Procedure Laterality Date   NO PAST SURGERIES     Patient Active Problem List   Diagnosis Date Noted   Normal labor 08/30/2021   SVD (spontaneous vaginal delivery) 08/30/2021    THERAPY DIAG:  Cervicalgia  Muscle weakness   Rationale for Evaluation and Treatment Rehabilitation  REFERRING DIAG: Cervicalgia [M54.2], Acute pain of left shoulder [M25.512], Myofascial pain syndrome [M79.18], Acute strain of neck muscle, initial encounter [S16.1XXA]   PERTINENT HISTORY: none  PRECAUTIONS/RESTRICTIONS:   none  SUBJECTIVE: Minimal change in symptoms but reports cervical mobility has slightly improved in rotation.   Pain:  Are you having pain? Yes Pain location: L>R neck pain into L medial scapula and L UT NPRS scale:  10/10 to 10/10 Aggravating factors: rotating to L, neck movements Relieving factors: hot shower does help Pain description: sharp, burning, and aching Stage: Acute Stability: staying the same 24 hour pattern: worse with activity   OBJECTIVE: (objective measures completed at initial evaluation unless otherwise dated)  OBJECTIVE:    DIAGNOSTIC FINDINGS:  CT clear   GENERAL OBSERVATION:          Hesitant to move                               SENSATION:          Light touch: Appears intact            PALPATION: Exquisite TTP L UT, periscapular, L Cx spine   Cervical ROM   ROM ROM  06/20/2022  Flexion 10  Extension 4  Right lateral flexion 5  Left lateral flexion 3  Right rotation 3  Left rotation 4  Flexion rotation (normal is 30 degrees)    Flexion rotation (normal is 30 degrees)      (Blank rows = not tested, N = WNL, * = concordant pain)   UPPER EXTREMITY MMT:   MMT Right 06/20/2022 Left 06/20/2022  Shoulder flexion WNL Unable to test strength d/t pain  Shoulder abduction (C5)      Shoulder ER      Shoulder IR      Middle trapezius      Lower trapezius      Shoulder extension      Grip strength      Shoulder shrug (C4)      Elbow flexion (C6)      Elbow ext (C7)      Thumb ext (C8)      Finger abd (T1)      Grossly        (Blank rows = not tested, score listed is out of 5 possible points.  N =  WNL, D = diminished, C = clear for gross weakness with myotome testing, * = concordant pain with testing)       PATIENT SURVEYS:  NDI 37/50         PATIENT EDUCATION:  POC, diagnosis, prognosis, HEP, and outcome measures.  Pt educated via explanation, demonstration, and handout (HEP).  Pt confirms understanding verbally.             HOME EXERCISE PROGRAM: None issued, encouraged heat   Treatment priorities     Eval (06/20/2022)              TDN              Manual for ROM                                                             [facility] Adult PT Treatment:                                                DATE: 07/05/22  Manual Therapy: Skilled palpation of upper trapezius B to identify taught bands [facility] Tr[facility] Point Dry Needling Treatment: Pre-treatment instruction: Patient instructed on dry needling rationale, procedures, and possible side effects including pain during treatment (achy,cramping feeling), bruising, drop of blood, lightheadedness, nausea, sweating. Patient Consent Given: Yes Education handout provided: Previously provided Muscles  treated: B UT  Needle size and number: .25x8730mm x 2 Electrical stimulation performed: No Parameters: N/A Treatment response/outcome: Twitch response elicited, Palpable decrease in muscle tension, and limited tolerance to procedure due to pain Post-treatment instructions: Patient instructed to expect possible mild to moderate muscle soreness later today and/or tomorrow. Patient instructed in methods to reduce muscle soreness and to continue prescribed HEP. If patient was dry needled over the lung field, patient was instructed on signs and symptoms of pneumothorax and, however unlikely, to see immediate medical attention should they occur. Patient was also educated on signs and symptoms of infection and to seek medical attention should they occur. Patient verbalized understanding of these instructions and education.  Seated myofacial techniques to R first rib, SCMs in supine and B UT's   Self Care: Discussed importance of attempting to increase ROM as well as fashioning a makeshift soft collar from a towel.  If  beneficial, she may benefit from use of a soft collar.  TREATMENT 4/3:  Manual Therapy: - Manual therapy: Skilled palpation to identify trigger points for TDN STM to all listed muscles following TDN K-tape bil UT and L periscapular region   Trigger Point Dry-Needling  Treatment instructions: Expect mild to moderate muscle soreness. S/S of pneumothorax if dry needled over a lung field, and to seek immediate medical attention should they occur. Patient verbalized understanding of these instructions and education.   Patient Consent Given: Yes Education handout provided: No Muscles treated: R sub occipital Electrical stimulation performed: No Parameters: N/A Treatment response/outcome: pain reduction    ASSESSMENT:   CLINICAL IMPRESSION: Continued high levels of pain in upper back and cervical region. Limited tolerance to manual interventions including gentle myofascial techniques,  positional release, sustained pressure to SCM insertions.  Reported diffuse LUE paresthesias periodically.  OBJECTIVE IMPAIRMENTS: Pain, neck ROM, UE strength limited d/t pain   ACTIVITY LIMITATIONS: lifting, caring for child, work, housework   PERSONAL FACTORS: See medical history and pertinent history     REHAB POTENTIAL: Good   CLINICAL DECISION MAKING: Stable/uncomplicated   EVALUATION COMPLEXITY: Low     GOALS:     SHORT TERM GOALS: Target date: 07/18/2022   Shelah will be >75% HEP compliant to improve carryover between sessions and facilitate independent management of condition   Evaluation (06/20/2022): ongoing Goal status: INITIAL     LONG TERM GOALS: Target date: 08/15/2022   Domini will show a >/= 17 pt improvement in their NDI score (MCID 7.5 pts) as a proxy for functional improvement   Evaluation/Baseline (06/20/2022): 37/50 pts Goal status: INITIAL     2.  Louise will self report >/= 50% decrease in pain from evaluation    Evaluation/Baseline (06/20/2022): 10/10 max pain Goal status: INITIAL     3.  Martin will be able to care for child including lifting and holding for prolonged perios, not limited by pain   Evaluation/Baseline (06/20/2022): limited Goal status: INITIAL     4.  Alisi will be able to return to work, not limited by pain   Evaluation/Baseline (06/20/2022): limited Goal status: INITIAL     5.  Amyla will improve the following MMTs to >/= 4/5 to show improvement in strength:  shoulder flexion    Evaluation/Baseline (06/20/2022): unable to complete d/t pain Goal status: INITIAL       PLAN: PT FREQUENCY: 1-2x/week   PT DURATION: 8 weeks (Ending 08/15/2022)   PLANNED INTERVENTIONS: Therapeutic exercises, Aquatic therapy, Therapeutic activity, Neuro Muscular re-education, Gait training, Patient/Family education, Joint mobilization, Dry Needling, Electrical stimulation, Spinal mobilization and/or manipulation, Moist heat, Taping,  Vasopneumatic device, Ionotophoresis 4mg /ml Dexamethasone, and Manual therapy   Hildred Laser PT 07/05/2022, 12:05 PM

## 2022-07-05 ENCOUNTER — Ambulatory Visit

## 2022-07-05 ENCOUNTER — Ambulatory Visit: Admitting: Physical Therapy

## 2022-07-05 DIAGNOSIS — M6281 Muscle weakness (generalized): Secondary | ICD-10-CM

## 2022-07-05 DIAGNOSIS — M542 Cervicalgia: Secondary | ICD-10-CM | POA: Diagnosis not present

## 2022-07-09 ENCOUNTER — Ambulatory Visit: Admitting: Physical Therapy

## 2022-07-11 ENCOUNTER — Ambulatory Visit

## 2022-07-11 DIAGNOSIS — M6281 Muscle weakness (generalized): Secondary | ICD-10-CM

## 2022-07-11 DIAGNOSIS — M542 Cervicalgia: Secondary | ICD-10-CM

## 2022-07-11 NOTE — Therapy (Signed)
OUTPATIENT PHYSICAL THERAPY TREATMENT NOTE   Patient Name: Marisa Haas MRN: [id number] DOB:22-Sep-1980, 42 y.o., female Today's Date: 07/11/2022  [facility]   REFERRING PROVIDER: Juanda Chance, NP   PT End of Session - 07/11/22 1050     Visit Number 4    Date for PT Re-Evaluation 08/15/22    Authorization Type [employer] MCD - NDI    PT Start Time 1050    PT Stop Time 1128    PT Time Calculation (min) 38 min    Activity Tolerance Patient limited by pain    Behavior During Therapy [facility] for tasks assessed/performed;Anxious              Past Medical History:  Diagnosis Date   Hypothyroidism    Past Surgical History:  Procedure Laterality Date   NO PAST SURGERIES     Patient Active Problem List   Diagnosis Date Noted   Normal labor 08/30/2021   SVD (spontaneous vaginal delivery) 08/30/2021    THERAPY DIAG:  Cervicalgia  Muscle weakness   Rationale for Evaluation and Treatment Rehabilitation  REFERRING DIAG: Cervicalgia [M54.2], Acute pain of left shoulder [M25.512], Myofascial pain syndrome [M79.18], Acute strain of neck muscle, initial encounter [S16.1XXA]   PERTINENT HISTORY: none  PRECAUTIONS/RESTRICTIONS:   none  SUBJECTIVE: Pt presents to PT with reports of continued severe L sided neck pain. Has tried to be compliant with HEP With no adverse effect.    Pain:  Are you having pain? Yes Pain location: L>R neck pain into L medial scapula and L UT NPRS scale:  10/10 to 10/10 Aggravating factors: rotating to L, neck movements Relieving factors: hot shower does help Pain description: sharp, burning, and aching Stage: Acute Stability: staying the same 24 hour pattern: worse with activity   OBJECTIVE: (objective measures completed at initial evaluation unless otherwise dated)  OBJECTIVE:    DIAGNOSTIC FINDINGS:  CT clear   GENERAL OBSERVATION:          Hesitant to move                               SENSATION:          Light  touch: Appears intact           PALPATION: Exquisite TTP L UT, periscapular, L Cx spine   Cervical ROM   ROM ROM  06/20/2022  Flexion 10  Extension 4  Right lateral flexion 5  Left lateral flexion 3  Right rotation 3  Left rotation 4  Flexion rotation (normal is 30 degrees)    Flexion rotation (normal is 30 degrees)      (Blank rows = not tested, N = WNL, * = concordant pain)   UPPER EXTREMITY MMT:   MMT Right 06/20/2022 Left 06/20/2022  Shoulder flexion WNL Unable to test strength d/t pain  Shoulder abduction (C5)      Shoulder ER      Shoulder IR      Middle trapezius      Lower trapezius      Shoulder extension      Grip strength      Shoulder shrug (C4)      Elbow flexion (C6)      Elbow ext (C7)      Thumb ext (C8)      Finger abd (T1)      Grossly        (Blank rows = not  tested, score listed is out of 5 possible points.  N = WNL, D = diminished, C = clear for gross weakness with myotome testing, * = concordant pain with testing)       PATIENT SURVEYS:  NDI 37/50         PATIENT EDUCATION:  POC, diagnosis, prognosis, HEP, and outcome measures.  Pt educated via explanation, demonstration, and handout (HEP).  Pt confirms understanding verbally.             HOME EXERCISE PROGRAM: None issued, encouraged heat   Treatment priorities     Eval (06/20/2022)              TDN              Manual for ROM                                                             TREATMENT: [facility] Adult PT Treatment:                                                DATE: 07/11/22 Manual Therapy: Skilled palpation of trigger points in left upper trap STM and positional release ot L upper trap Gentle suboccipital release  Trigger Point Dry Needling Treatment: Pre-treatment instruction: Patient instructed on dry needling rationale, procedures, and possible side effects including pain during treatment (achy,cramping feeling), bruising, drop of blood, lightheadedness, nausea,  sweating. Patient Consent Given: Yes Education handout provided: Previously provided Muscles treated: left upper trap  Needle size and number: .25x5530mm x 1 Electrical stimulation performed: No Parameters: N/A Treatment response/outcome:  Patient did not tolerate well;  Post-treatment instructions: Patient instructed to expect possible mild to moderate muscle soreness later today and/or tomorrow. Patient instructed in methods to reduce muscle soreness and to continue prescribed HEP. If patient was dry needled over the lung field, patient was instructed on signs and symptoms of pneumothorax and, however unlikely, to see immediate medical attention should they occur. Patient was also educated on signs and symptoms of infection and to seek medical attention should they occur. Patient verbalized understanding of these instructions and education.  [facility] Adult PT Treatment:                                                DATE: 07/05/22 Manual Therapy: Skilled palpation of upper trapezius B to identify taught bands [facility] Trigger Point Dry Needling Treatment: Pre-treatment instruction: Patient instructed on dry needling rationale, procedures, and possible side effects including pain during treatment (achy,cramping feeling), bruising, drop of blood, lightheadedness, nausea, sweating. Patient Consent Given: Yes Education handout provided: Previously provided Muscles treated: B UT  Needle size and number: .25x2530mm x 2 Electrical stimulation performed: No Parameters: N/A Treatment response/outcome: Twitch response elicited, Palpable decrease in muscle tension, and limited tolerance to procedure due to pain Post-treatment instructions: Patient instructed to expect possible mild to moderate muscle soreness later today and/or tomorrow. Patient instructed in methods to reduce muscle soreness and to continue prescribed HEP. If patient was  dry needled over the lung field, patient was instructed on signs and  symptoms of pneumothorax and, however unlikely, to see immediate medical attention should they occur. Patient was also educated on signs and symptoms of infection and to seek medical attention should they occur. Patient verbalized understanding of these instructions and education.  Seated myofacial techniques to R first rib, SCMs in supine and B UT's   Self Care: Discussed importance of attempting to increase ROM as well as fashioning a makeshift soft collar from a towel.  If  beneficial, she may benefit from use of a soft collar.  TREATMENT 4/3:  Manual Therapy: - Manual therapy: Skilled palpation to identify trigger points for TDN STM to all listed muscles following TDN K-tape bil UT and L periscapular region   Trigger Point Dry-Needling  Treatment instructions: Expect mild to moderate muscle soreness. S/S of pneumothorax if dry needled over a lung field, and to seek immediate medical attention should they occur. Patient verbalized understanding of these instructions and education.   Patient Consent Given: Yes Education handout provided: No Muscles treated: R sub occipital Electrical stimulation performed: No Parameters: N/A Treatment response/outcome: pain reduction    ASSESSMENT:   CLINICAL IMPRESSION: Pt tolerated treatment fair but continued to be limited by severe L sided neck pain. TPDN was stopped due to pt apprehension, however, she seemed to respond better to [facility] and other manual therapy interventions. She continues to benefit from skilled PT services, will continue to progress manual and DNF exercises as able per POC.    OBJECTIVE IMPAIRMENTS: Pain, neck ROM, UE strength limited d/t pain   ACTIVITY LIMITATIONS: lifting, caring for child, work, housework   PERSONAL FACTORS: See medical history and pertinent history     REHAB POTENTIAL: Good   CLINICAL DECISION MAKING: Stable/uncomplicated   EVALUATION COMPLEXITY: Low     GOALS:     SHORT TERM GOALS: Target  date: 07/18/2022   Marisa Haas will be >75% HEP compliant to improve carryover between sessions and facilitate independent management of condition   Evaluation (06/20/2022): ongoing Goal status: INITIAL     LONG TERM GOALS: Target date: 08/15/2022   Marisa Haas will show a >/= 17 pt improvement in their NDI score (MCID 7.5 pts) as a proxy for functional improvement   Evaluation/Baseline (06/20/2022): 37/50 pts Goal status: INITIAL     2.  Marisa Haas will self report >/= 50% decrease in pain from evaluation    Evaluation/Baseline (06/20/2022): 10/10 max pain Goal status: INITIAL     3.  Marisa Haas will be able to care for child including lifting and holding for prolonged perios, not limited by pain   Evaluation/Baseline (06/20/2022): limited Goal status: INITIAL     4.  Marisa Haas will be able to return to work, not limited by pain   Evaluation/Baseline (06/20/2022): limited Goal status: INITIAL     5.  Marisa Haas will improve the following MMTs to >/= 4/5 to show improvement in strength:  shoulder flexion    Evaluation/Baseline (06/20/2022): unable to complete d/t pain Goal status: INITIAL       PLAN: PT FREQUENCY: 1-2x/week   PT DURATION: 8 weeks (Ending 08/15/2022)   PLANNED INTERVENTIONS: Therapeutic exercises, Aquatic therapy, Therapeutic activity, Neuro Muscular re-education, Gait training, Patient/Family education, Joint mobilization, Dry Needling, Electrical stimulation, Spinal mobilization and/or manipulation, Moist heat, Taping, Vasopneumatic device, Ionotophoresis 4mg /ml Dexamethasone, and Manual therapy   Eloy End PT 07/11/2022, 12:13 PM

## 2022-07-16 ENCOUNTER — Encounter: Payer: Self-pay | Admitting: Physical Therapy

## 2022-07-16 ENCOUNTER — Ambulatory Visit: Admitting: Physical Therapy

## 2022-07-16 DIAGNOSIS — M542 Cervicalgia: Secondary | ICD-10-CM

## 2022-07-16 DIAGNOSIS — M6281 Muscle weakness (generalized): Secondary | ICD-10-CM

## 2022-07-16 NOTE — Therapy (Signed)
OUTPATIENT PHYSICAL THERAPY TREATMENT NOTE   Patient Name: Marisa Haas MRN: [id number] DOB:1980/09/22, 42 y.o., female Today's Date: 07/16/2022  PCP: [facility]   REFERRING PROVIDER: Juanda Chance, NP   PT End of Session - 07/16/22 1308     Visit Number 5    Date for PT Re-Evaluation 08/15/22    Authorization Type [employer] MCD - NDI    PT Start Time 0107    PT Stop Time 0145    PT Time Calculation (min) 38 min    Activity Tolerance Patient limited by pain    Behavior During Therapy [facility] for tasks assessed/performed;Anxious              Past Medical History:  Diagnosis Date   Hypothyroidism    Past Surgical History:  Procedure Laterality Date   NO PAST SURGERIES     Patient Active Problem List   Diagnosis Date Noted   Normal labor 08/30/2021   SVD (spontaneous vaginal delivery) 08/30/2021    THERAPY DIAG:  Cervicalgia  Muscle weakness   Rationale for Evaluation and Treatment Rehabilitation  REFERRING DIAG: Cervicalgia [M54.2], Acute pain of left shoulder [M25.512], Myofascial pain syndrome [M79.18], Acute strain of neck muscle, initial encounter [S16.1XXA]   PERTINENT HISTORY: none  PRECAUTIONS/RESTRICTIONS:   none  SUBJECTIVE: Pt reports that MT last visit was helpful and she feels slightly better.   Pain:  Are you having pain? Yes Pain location: L>R neck pain into L medial scapula and L UT NPRS scale:  8/10 to 10/10 Aggravating factors: rotating to L, neck movements Relieving factors: hot shower does help Pain description: sharp, burning, and aching Stage: Acute Stability: staying the same 24 hour pattern: worse with activity   OBJECTIVE: (objective measures completed at initial evaluation unless otherwise dated)  OBJECTIVE:    DIAGNOSTIC FINDINGS:  CT clear   GENERAL OBSERVATION:          Hesitant to move                               SENSATION:          Light touch: Appears intact           PALPATION: Exquisite  TTP L UT, periscapular, L Cx spine   Cervical ROM   ROM ROM  06/20/2022 4/16  Flexion 10   Extension 4   Right lateral flexion 5   Left lateral flexion 3   Right rotation 3 20  Left rotation 4 15  Flexion rotation (normal is 30 degrees)     Flexion rotation (normal is 30 degrees)       (Blank rows = not tested, N = WNL, * = concordant pain)   UPPER EXTREMITY MMT:   MMT Right 06/20/2022 Left 06/20/2022  Shoulder flexion WNL Unable to test strength d/t pain  Shoulder abduction (C5)      Shoulder ER      Shoulder IR      Middle trapezius      Lower trapezius      Shoulder extension      Grip strength      Shoulder shrug (C4)      Elbow flexion (C6)      Elbow ext (C7)      Thumb ext (C8)      Finger abd (T1)      Grossly        (Blank rows = not tested, score listed  is out of 5 possible points.  N = WNL, D = diminished, C = clear for gross weakness with myotome testing, * = concordant pain with testing)       PATIENT SURVEYS:  NDI 37/50         PATIENT EDUCATION:  POC, diagnosis, prognosis, HEP, and outcome measures.  Pt educated via explanation, demonstration, and handout (HEP).  Pt confirms understanding verbally.             HOME EXERCISE PROGRAM: None issued, encouraged heat   Treatment priorities     Eval (06/20/2022)              TDN              Manual for ROM                                                             TREATMENT: [facility] Adult PT Treatment:                                                DATE: 07/16/22 Manual Therapy: STM and positional release of L and L LS upper trap Gentle suboccipital release    [facility] Adult PT Treatment:                                                DATE: 07/05/22 Manual Therapy: Skilled palpation of upper trapezius B to identify taught bands prior to [facility] Trigger Point Dry Needling Treatment: Pre-treatment instruction: Patient instructed on dry needling rationale, procedures, and possible side effects including  pain during treatment (achy,cramping feeling), bruising, drop of blood, lightheadedness, nausea, sweating. Patient Consent Given: Yes Education handout provided: Previously provided Muscles treated: B UT  Needle size and number: .25x71mm x 2 Electrical stimulation performed: No Parameters: N/A Treatment response/outcome: Twitch response elicited, Palpable decrease in muscle tension, and limited tolerance to procedure due to pain Post-treatment instructions: Patient instructed to expect possible mild to moderate muscle soreness later today and/or tomorrow. Patient instructed in methods to reduce muscle soreness and to continue prescribed HEP. If patient was dry needled over the lung field, patient was instructed on signs and symptoms of pneumothorax and, however unlikely, to see immediate medical attention should they occur. Patient was also educated on signs and symptoms of infection and to seek medical attention should they occur. Patient verbalized understanding of these instructions and education.  Seated myofacial techniques to R first rib, SCMs in supine and B UT's   Self Care: Discussed importance of attempting to increase ROM as well as fashioning a makeshift soft collar from a towel.  If  beneficial, she may benefit from use of a soft collar.  TREATMENT 4/3:  Manual Therapy: - Manual therapy: Skilled palpation to identify trigger points for TDN STM to all listed muscles following TDN K-tape bil UT and L periscapular region   Trigger Point Dry-Needling  Treatment instructions: Expect mild to moderate muscle soreness. S/S of pneumothorax if dry needled over a lung field, and to seek immediate  medical attention should they occur. Patient verbalized understanding of these instructions and education.   Patient Consent Given: Yes Education handout provided: No Muscles treated: R sub occipital Electrical stimulation performed: No Parameters: N/A Treatment response/outcome: pain  reduction    ASSESSMENT:   CLINICAL IMPRESSION:   Pt limited in therex d/t pain. She shows significant improvement in rotation ROM, but is still significantly limited.  Pt reports significant improvement in pain following MT.   OBJECTIVE IMPAIRMENTS: Pain, neck ROM, UE strength limited d/t pain   ACTIVITY LIMITATIONS: lifting, caring for child, work, housework   PERSONAL FACTORS: See medical history and pertinent history     REHAB POTENTIAL: Good   CLINICAL DECISION MAKING: Stable/uncomplicated   EVALUATION COMPLEXITY: Low     GOALS:     SHORT TERM GOALS: Target date: 07/18/2022   Thania will be >75% HEP compliant to improve carryover between sessions and facilitate independent management of condition   Evaluation (06/20/2022): ongoing Goal status: MET     LONG TERM GOALS: Target date: 08/15/2022   Alette will show a >/= 17 pt improvement in their NDI score (MCID 7.5 pts) as a proxy for functional improvement   Evaluation/Baseline (06/20/2022): 37/50 pts Goal status: INITIAL     2.  Patrece will self report >/= 50% decrease in pain from evaluation    Evaluation/Baseline (06/20/2022): 10/10 max pain Goal status: INITIAL     3.  Cookie will be able to care for child including lifting and holding for prolonged perios, not limited by pain   Evaluation/Baseline (06/20/2022): limited Goal status: INITIAL     4.  Hayzlee will be able to return to work, not limited by pain   Evaluation/Baseline (06/20/2022): limited Goal status: INITIAL     5.  Jenine will improve the following MMTs to >/= 4/5 to show improvement in strength:  shoulder flexion    Evaluation/Baseline (06/20/2022): unable to complete d/t pain Goal status: INITIAL       PLAN: PT FREQUENCY: 1-2x/week   PT DURATION: 8 weeks (Ending 08/15/2022)   PLANNED INTERVENTIONS: Therapeutic exercises, Aquatic therapy, Therapeutic activity, Neuro Muscular re-education, Gait training, Patient/Family education,  Joint mobilization, Dry Needling, Electrical stimulation, Spinal mobilization and/or manipulation, Moist heat, Taping, Vasopneumatic device, Ionotophoresis /ml Dexamethasone, and Manual therapy   Kimberlee Nearing Johannah Rozas PT 07/16/2022, 1:54 PM

## 2022-07-18 ENCOUNTER — Ambulatory Visit: Admitting: Physical Therapy

## 2022-07-23 ENCOUNTER — Ambulatory Visit

## 2022-07-25 ENCOUNTER — Ambulatory Visit

## 2022-07-29 ENCOUNTER — Ambulatory Visit: Admitting: Physical Medicine and Rehabilitation

## 2022-07-29 ENCOUNTER — Ambulatory Visit (INDEPENDENT_AMBULATORY_CARE_PROVIDER_SITE_OTHER): Admitting: Physical Medicine and Rehabilitation

## 2022-07-29 ENCOUNTER — Encounter: Payer: Self-pay | Admitting: Physical Medicine and Rehabilitation

## 2022-07-29 DIAGNOSIS — M542 Cervicalgia: Secondary | ICD-10-CM | POA: Diagnosis not present

## 2022-07-29 DIAGNOSIS — M7918 Myalgia, other site: Secondary | ICD-10-CM | POA: Diagnosis not present

## 2022-07-29 DIAGNOSIS — M25512 Pain in left shoulder: Secondary | ICD-10-CM

## 2022-07-29 NOTE — Progress Notes (Unsigned)
Functional Pain Scale - descriptive words and definitions  Moderate (4)   Constantly aware of pain, can complete ADLs with modification/sleep marginally affected at times/passive distraction is of no use, but active distraction gives some relief. Moderate range order  Average Pain  varies, worse is 7-8  Neck pain. Patient is feeling better due to PT
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
## 2022-07-30 ENCOUNTER — Ambulatory Visit

## 2022-07-30 DIAGNOSIS — M542 Cervicalgia: Secondary | ICD-10-CM

## 2022-07-30 DIAGNOSIS — M6281 Muscle weakness (generalized): Secondary | ICD-10-CM

## 2022-07-30 NOTE — Therapy (Signed)
OUTPATIENT PHYSICAL THERAPY TREATMENT NOTE   Patient Name: Marisa Haas MRN: [id number] DOB:Jul 08, 1980, 42 y.o., female Today's Date: 07/30/2022  PCP: [facility] REFERRING PROVIDER: Juanda Chance, NP   PT End of Session - 07/30/22 1221     Visit Number 6    Date for PT Re-Evaluation 08/15/22    Authorization Type [employer] MCD - NDI    PT Start Time 1221   arrived late   PT Stop Time 1248    PT Time Calculation (min) 27 min    Activity Tolerance Patient limited by pain    Behavior During Therapy [facility] for tasks assessed/performed;Anxious               Past Medical History:  Diagnosis Date   Hypothyroidism    Past Surgical History:  Procedure Laterality Date   NO PAST SURGERIES     Patient Active Problem List   Diagnosis Date Noted   Normal labor 08/30/2021   SVD (spontaneous vaginal delivery) 08/30/2021    THERAPY DIAG:  Cervicalgia  Muscle weakness   Rationale for Evaluation and Treatment Rehabilitation  REFERRING DIAG: Cervicalgia [M54.2], Acute pain of left shoulder [M25.512], Myofascial pain syndrome [M79.18], Acute strain of neck muscle, initial encounter [S16.1XXA]   PERTINENT HISTORY: none  PRECAUTIONS/RESTRICTIONS: None  SUBJECTIVE: Pt presents to PT with reports of continued severe neck pain, L>R. Notes benefit to therapy last two sessions, although pain came back.    Pain:  Are you having pain? Yes Pain location: L>R neck pain into L medial scapula and L UT NPRS scale:  8/10 to 10/10 Aggravating factors: rotating to L, neck movements Relieving factors: hot shower does help Pain description: sharp, burning, and aching Stage: Acute Stability: staying the same 24 hour pattern: worse with activity   OBJECTIVE: (objective measures completed at initial evaluation unless otherwise dated)  OBJECTIVE:    DIAGNOSTIC FINDINGS:  CT clear   GENERAL OBSERVATION:          Hesitant to move                               SENSATION:           Light touch: Appears intact           PALPATION: Exquisite TTP L UT, periscapular, L Cx spine   Cervical ROM   ROM ROM  06/20/2022 4/16  Flexion 10   Extension 4   Right lateral flexion 5   Left lateral flexion 3   Right rotation 3 20  Left rotation 4 15  Flexion rotation (normal is 30 degrees)     Flexion rotation (normal is 30 degrees)       (Blank rows = not tested, N = WNL, * = concordant pain)   UPPER EXTREMITY MMT:   MMT Right 06/20/2022 Left 06/20/2022  Shoulder flexion WNL Unable to test strength d/t pain  Shoulder abduction (C5)      Shoulder ER      Shoulder IR      Middle trapezius      Lower trapezius      Shoulder extension      Grip strength      Shoulder shrug (C4)      Elbow flexion (C6)      Elbow ext (C7)      Thumb ext (C8)      Finger abd (T1)      Grossly        (  Blank rows = not tested, score listed is out of 5 possible points.  N = WNL, D = diminished, C = clear for gross weakness with myotome testing, * = concordant pain with testing)       PATIENT SURVEYS:  NDI 37/50         PATIENT EDUCATION:  POC, diagnosis, prognosis, HEP, and outcome measures.  Pt educated via explanation, demonstration, and handout (HEP).  Pt confirms understanding verbally.             HOME EXERCISE PROGRAM: None issued, encouraged heat   Treatment priorities     Eval (06/20/2022)              TDN              Manual for ROM                                                             TREATMENT: [facility] Adult PT Treatment:                                                DATE: 07/30/22 Manual Therapy: STM and positional release of L upper trap and L LS Gentle suboccipital release   [facility] Adult PT Treatment:                                                DATE: 07/16/22 Manual Therapy: STM and positional release of L and L LS upper trap Gentle suboccipital release    [facility] Adult PT Treatment:                                                DATE:  07/05/22 Manual Therapy: Skilled palpation of upper trapezius B to identify taught bands prior to [facility] Trigger Point Dry Needling Treatment: Pre-treatment instruction: Patient instructed on dry needling rationale, procedures, and possible side effects including pain during treatment (achy,cramping feeling), bruising, drop of blood, lightheadedness, nausea, sweating. Patient Consent Given: Yes Education handout provided: Previously provided Muscles treated: B UT  Needle size and number: .25x51mm x 2 Electrical stimulation performed: No Parameters: N/A Treatment response/outcome: Twitch response elicited, Palpable decrease in muscle tension, and limited tolerance to procedure due to pain Post-treatment instructions: Patient instructed to expect possible mild to moderate muscle soreness later today and/or tomorrow. Patient instructed in methods to reduce muscle soreness and to continue prescribed HEP. If patient was dry needled over the lung field, patient was instructed on signs and symptoms of pneumothorax and, however unlikely, to see immediate medical attention should they occur. Patient was also educated on signs and symptoms of infection and to seek medical attention should they occur. Patient verbalized understanding of these instructions and education.  Seated myofacial techniques to R first rib, SCMs in supine and B UT's  Self Care: Discussed importance of attempting to increase ROM as well as fashioning a makeshift  soft collar from a towel.  If  beneficial, she may benefit from use of a soft collar.  ASSESSMENT:   CLINICAL IMPRESSION:  Pt responded well to manual therapy interventions, noting decrease in pain. Session unfortunately ended early due to needing to care for her young children who were present in the clinic. Will continue with manual therapy and slowly progress towards more therapeutic exercise as tolerated.    OBJECTIVE IMPAIRMENTS: Pain, neck ROM, UE strength limited d/t  pain   ACTIVITY LIMITATIONS: lifting, caring for child, work, housework   PERSONAL FACTORS: See medical history and pertinent history     GOALS:     SHORT TERM GOALS: Target date: 07/18/2022   Earlisha will be >75% HEP compliant to improve carryover between sessions and facilitate independent management of condition   Evaluation (06/20/2022): ongoing Goal status: MET     LONG TERM GOALS: Target date: 08/15/2022   Shalaine will show a >/= 17 pt improvement in their NDI score (MCID 7.5 pts) as a proxy for functional improvement   Evaluation/Baseline (06/20/2022): 37/50 pts Goal status: INITIAL     2.  Makela will self report >/= 50% decrease in pain from evaluation    Evaluation/Baseline (06/20/2022): 10/10 max pain Goal status: INITIAL     3.  Tierany will be able to care for child including lifting and holding for prolonged perios, not limited by pain   Evaluation/Baseline (06/20/2022): limited Goal status: INITIAL     4.  Kalissa will be able to return to work, not limited by pain   Evaluation/Baseline (06/20/2022): limited Goal status: INITIAL     5.  Drenda will improve the following MMTs to >/= 4/5 to show improvement in strength:  shoulder flexion    Evaluation/Baseline (06/20/2022): unable to complete d/t pain Goal status: INITIAL       PLAN: PT FREQUENCY: 1-2x/week   PT DURATION: 8 weeks (Ending 08/15/2022)   PLANNED INTERVENTIONS: Therapeutic exercises, Aquatic therapy, Therapeutic activity, Neuro Muscular re-education, Gait training, Patient/Family education, Joint mobilization, Dry Needling, Electrical stimulation, Spinal mobilization and/or manipulation, Moist heat, Taping, Vasopneumatic device, Ionotophoresis 4mg /ml Dexamethasone, and Manual therapy   Eloy End PT 07/30/2022, 1:37 PM

## 2022-07-31 NOTE — Therapy (Signed)
OUTPATIENT PHYSICAL THERAPY TREATMENT NOTE   Patient Name: Marisa Haas MRN: [id number] DOB:04-02-1980, 42 y.o., female Today's Date: 08/01/2022  PCP: [facility] REFERRING PROVIDER: Juanda Chance, NP   PT End of Session - 08/01/22 1234     Visit Number 7    Date for PT Re-Evaluation 08/15/22    Authorization Type [employer] MCD - NDI    PT Start Time 1234   arrived late   PT Stop Time 1258    PT Time Calculation (min) 24 min    Activity Tolerance Patient limited by pain    Behavior During Therapy [facility] for tasks assessed/performed;Anxious                Past Medical History:  Diagnosis Date   Hypothyroidism    Past Surgical History:  Procedure Laterality Date   NO PAST SURGERIES     Patient Active Problem List   Diagnosis Date Noted   Normal labor 08/30/2021   SVD (spontaneous vaginal delivery) 08/30/2021    THERAPY DIAG:  Cervicalgia  Muscle weakness   Rationale for Evaluation and Treatment Rehabilitation  REFERRING DIAG: Cervicalgia [M54.2], Acute pain of left shoulder [M25.512], Myofascial pain syndrome [M79.18], Acute strain of neck muscle, initial encounter [S16.1XXA]   PERTINENT HISTORY: none  PRECAUTIONS/RESTRICTIONS: None  SUBJECTIVE: Pt presents to PT with reports slight decrease in pain today.   Pain:  Are you having pain? Yes Pain location: L>R neck pain into L medial scapula and L UT NPRS scale:  5/10 to 10/10 Aggravating factors: rotating to L, neck movements Relieving factors: hot shower does help Pain description: sharp, burning, and aching Stage: Acute Stability: staying the same 24 hour pattern: worse with activity   OBJECTIVE: (objective measures completed at initial evaluation unless otherwise dated)  OBJECTIVE:    DIAGNOSTIC FINDINGS:  CT clear   GENERAL OBSERVATION:          Hesitant to move                               SENSATION:          Light touch: Appears intact           PALPATION: Exquisite TTP  L UT, periscapular, L Cx spine   Cervical ROM   ROM ROM  06/20/2022 4/16  Flexion 10   Extension 4   Right lateral flexion 5   Left lateral flexion 3   Right rotation 3 20  Left rotation 4 15  Flexion rotation (normal is 30 degrees)     Flexion rotation (normal is 30 degrees)       (Blank rows = not tested, N = WNL, * = concordant pain)   UPPER EXTREMITY MMT:   MMT Right 06/20/2022 Left 06/20/2022  Shoulder flexion WNL Unable to test strength d/t pain  Shoulder abduction (C5)      Shoulder ER      Shoulder IR      Middle trapezius      Lower trapezius      Shoulder extension      Grip strength      Shoulder shrug (C4)      Elbow flexion (C6)      Elbow ext (C7)      Thumb ext (C8)      Finger abd (T1)      Grossly        (Blank rows = not tested, score listed is  out of 5 possible points.  N = WNL, D = diminished, C = clear for gross weakness with myotome testing, * = concordant pain with testing)   PATIENT SURVEYS:  NDI 37/50         PATIENT EDUCATION:  POC, diagnosis, prognosis, HEP, and outcome measures.  Pt educated via explanation, demonstration, and handout (HEP).  Pt confirms understanding verbally.             HOME EXERCISE PROGRAM: None issued, encouraged heat   Treatment priorities     Eval (06/20/2022)              TDN              Manual for ROM                                                             TREATMENT: [facility] Adult PT Treatment:                                                DATE: 08/01/22 Manual Therapy: STM and positional release of L upper trap and L LS Gentle suboccipital release  Therapeutic Exercise: Seated chin tuck x 10 Seated ER 2x10 YTB  ASSESSMENT:   CLINICAL IMPRESSION:  Pt responded well to manual therapy interventions, noting decrease in pain. Incorporated DNF and gentle periscapular strengthening to tolerance today. Will continue with manual therapy and slowly progress towards more therapeutic exercise as tolerated.     OBJECTIVE IMPAIRMENTS: Pain, neck ROM, UE strength limited d/t pain   ACTIVITY LIMITATIONS: lifting, caring for child, work, housework   PERSONAL FACTORS: See medical history and pertinent history     GOALS:     SHORT TERM GOALS: Target date: 07/18/2022   Alexus will be >75% HEP compliant to improve carryover between sessions and facilitate independent management of condition   Evaluation (06/20/2022): ongoing Goal status: MET     LONG TERM GOALS: Target date: 08/15/2022   Anamae will show a >/= 17 pt improvement in their NDI score (MCID 7.5 pts) as a proxy for functional improvement   Evaluation/Baseline (06/20/2022): 37/50 pts Goal status: INITIAL     2.  Meisha will self report >/= 50% decrease in pain from evaluation    Evaluation/Baseline (06/20/2022): 10/10 max pain Goal status: INITIAL     3.  Tallula will be able to care for child including lifting and holding for prolonged perios, not limited by pain   Evaluation/Baseline (06/20/2022): limited Goal status: INITIAL     4.  Eyonna will be able to return to work, not limited by pain   Evaluation/Baseline (06/20/2022): limited Goal status: INITIAL     5.  Comfort will improve the following MMTs to >/= 4/5 to show improvement in strength:  shoulder flexion    Evaluation/Baseline (06/20/2022): unable to complete d/t pain Goal status: INITIAL       PLAN: PT FREQUENCY: 1-2x/week   PT DURATION: 8 weeks (Ending 08/15/2022)   PLANNED INTERVENTIONS: Therapeutic exercises, Aquatic therapy, Therapeutic activity, Neuro Muscular re-education, Gait training, Patient/Family education, Joint mobilization, Dry Needling, Electrical stimulation, Spinal mobilization and/or manipulation, Moist heat, Taping, Vasopneumatic device, Ionotophoresis  4mg /ml Dexamethasone, and Manual therapy   Eloy End PT 08/01/2022, 1:44 PM

## 2022-08-01 ENCOUNTER — Ambulatory Visit: Attending: Physical Medicine and Rehabilitation

## 2022-08-01 DIAGNOSIS — M542 Cervicalgia: Secondary | ICD-10-CM | POA: Insufficient documentation

## 2022-08-01 DIAGNOSIS — M6281 Muscle weakness (generalized): Secondary | ICD-10-CM | POA: Insufficient documentation

## 2022-08-07 ENCOUNTER — Ambulatory Visit

## 2022-08-07 DIAGNOSIS — M542 Cervicalgia: Secondary | ICD-10-CM | POA: Diagnosis not present

## 2022-08-07 DIAGNOSIS — M6281 Muscle weakness (generalized): Secondary | ICD-10-CM

## 2022-08-07 NOTE — Therapy (Signed)
OUTPATIENT PHYSICAL THERAPY TREATMENT NOTE   Patient Name: Marisa Haas MRN: [id number] DOB:1981-03-08, 42 y.o., female Today's Date: 08/07/2022  PCP: [facility], [facility] REFERRING PROVIDER: Juanda Chance, NP   PT Haas of Session - 08/07/22 1159     Visit Number 8    Date for PT Re-Evaluation 08/15/22    Authorization Type [employer] MCD - NDI    PT Start Time 1215    PT Stop Time 1253    PT Time Calculation (min) 38 min    Activity Tolerance Patient limited by pain    Behavior During Therapy [facility] for tasks assessed/performed;Anxious                 Past Medical History:  Diagnosis Date   Hypothyroidism    Past Surgical History:  Procedure Laterality Date   NO PAST SURGERIES     Patient Active Problem List   Diagnosis Date Noted   Normal labor 08/30/2021   SVD (spontaneous vaginal delivery) 08/30/2021    THERAPY DIAG:  Cervicalgia  Muscle weakness   Rationale for Evaluation and Treatment Rehabilitation  REFERRING DIAG: Cervicalgia [M54.2], Acute pain of left shoulder [M25.512], Myofascial pain syndrome [M79.18], Acute strain of neck muscle, initial encounter [S16.1XXA]   PERTINENT HISTORY: none  PRECAUTIONS/RESTRICTIONS: None  SUBJECTIVE: Pt presents to PT with reports of decreased neck pain. Has been compliant with HEP.    Pain:  Are you having pain? Yes Pain location: L>R neck pain into L medial scapula and L UT NPRS scale: 5/10 Aggravating factors: rotating to L, neck movements Relieving factors: hot shower does help Pain description: sharp, burning, and aching Stage: Acute Stability: staying the same 24 hour pattern: worse with activity   OBJECTIVE: (objective measures completed at initial evaluation unless otherwise dated)   DIAGNOSTIC FINDINGS:  CT clear   GENERAL OBSERVATION:          Hesitant to move                               SENSATION:          Light touch: Appears intact           PALPATION: Exquisite TTP L UT,  periscapular, L Cx spine   Cervical ROM   ROM ROM  06/20/2022 4/16  Flexion 10   Extension 4   Right lateral flexion 5   Left lateral flexion 3   Right rotation 3 20  Left rotation 4 15  Flexion rotation (normal is 30 degrees)     Flexion rotation (normal is 30 degrees)       (Blank rows = not tested, N = WNL, * = concordant pain)   UPPER EXTREMITY MMT:   MMT Right 06/20/2022 Left 06/20/2022  Shoulder flexion WNL Unable to test strength d/t pain  Shoulder abduction (C5)      Shoulder ER      Shoulder IR      Middle trapezius      Lower trapezius      Shoulder extension      Grip strength      Shoulder shrug (C4)      Elbow flexion (C6)      Elbow ext (C7)      Thumb ext (C8)      Finger abd (T1)      Grossly        (Blank rows = not tested, score listed is out of 5  possible points.  N = WNL, D = diminished, C = clear for gross weakness with myotome testing, * = concordant pain with testing)   PATIENT SURVEYS:  NDI 37/50         PATIENT EDUCATION:  POC, diagnosis, prognosis, HEP, and outcome measures.  Pt educated via explanation, demonstration, and handout (HEP).  Pt confirms understanding verbally.             HOME EXERCISE PROGRAM: None issued, encouraged heat   Treatment priorities     Eval (06/20/2022)              TDN              Manual for ROM                                                             TREATMENT: [facility] Adult PT Treatment:                                                DATE: 08/07/22 Manual Therapy: STM and positional release of L upper trap and L LS Gentle suboccipital release  Therapeutic Exercise: Seated chin tuck 2x10 Seated bilat ER x 10 YTB  ASSESSMENT:   CLINICAL IMPRESSION:  Pt completed prescribed exercise and noted decreased pain post session. Is benefit from skilled PT, will continue per POC.    OBJECTIVE IMPAIRMENTS: Pain, neck ROM, UE strength limited d/t pain   ACTIVITY LIMITATIONS: lifting, caring for child,  work, housework   PERSONAL FACTORS: See medical history and pertinent history     GOALS:     SHORT TERM GOALS: Target date: 07/18/2022   Marisa Haas will be >75% HEP compliant to improve carryover between sessions and facilitate independent management of condition   Evaluation (06/20/2022): ongoing Goal status: MET     LONG TERM GOALS: Target date: 08/15/2022   [name] will show a >/= 17 pt improvement in their NDI score (MCID 7.5 pts) as a proxy for functional improvement   Evaluation/Baseline (06/20/2022): 37/50 pts Goal status: INITIAL     2.  Marisa Haas will self report >/= 50% decrease in pain from evaluation    Evaluation/Baseline (06/20/2022): 10/10 max pain Goal status: INITIAL     3.  Marisa Haas will be able to care for child including lifting and holding for prolonged perios, not limited by pain   Evaluation/Baseline (06/20/2022): limited Goal status: INITIAL     4.  Marisa Haas will be able to return to work, not limited by pain   Evaluation/Baseline (06/20/2022): limited Goal status: INITIAL     5.  Marisa Haas will improve the following MMTs to >/= 4/5 to show improvement in strength:  shoulder flexion    Evaluation/Baseline (06/20/2022): unable to complete d/t pain Goal status: INITIAL       PLAN: PT FREQUENCY: 1-2x/week   PT DURATION: 8 weeks (Ending 08/15/2022)   PLANNED INTERVENTIONS: Therapeutic exercises, Aquatic therapy, Therapeutic activity, Neuro Muscular re-education, Gait training, Patient/Family education, Joint mobilization, Dry Needling, Electrical stimulation, Spinal mobilization and/or manipulation, Moist heat, Taping, Vasopneumatic device, Ionotophoresis 4mg /ml Dexamethasone, and Manual therapy   Marisa Haas PT 08/07/2022, 1:44 PM

## 2022-08-20 ENCOUNTER — Ambulatory Visit

## 2022-08-20 DIAGNOSIS — M542 Cervicalgia: Secondary | ICD-10-CM | POA: Diagnosis not present

## 2022-08-20 DIAGNOSIS — M6281 Muscle weakness (generalized): Secondary | ICD-10-CM

## 2022-08-20 NOTE — Therapy (Signed)
OUTPATIENT PHYSICAL THERAPY TREATMENT NOTE   Patient Name: Marisa Haas MRN: [id number] DOB:[birth year], 42 y.o., female Today's Date: 08/20/2022  PCP: [facility] REFERRING PROVIDER: Juanda Chance, NP   PT End of Session - 08/20/22 1445     Visit Number 9    Date for PT Re-Evaluation 09/17/22    Authorization Type [employer] MCD - NDI    PT Start Time 1445    PT Stop Time 1524    PT Time Calculation (min) 39 min    Activity Tolerance Patient limited by pain    Behavior During Therapy [facility] for tasks assessed/performed;Anxious                  Past Medical History:  Diagnosis Date   Hypothyroidism    Past Surgical History:  Procedure Laterality Date   NO PAST SURGERIES     Patient Active Problem List   Diagnosis Date Noted   Normal labor 08/30/2021   SVD (spontaneous vaginal delivery) 08/30/2021    THERAPY DIAG:  Cervicalgia  Muscle weakness   Rationale for Evaluation and Treatment Rehabilitation  REFERRING DIAG: Cervicalgia [M54.2], Acute pain of left shoulder [M25.512], Myofascial pain syndrome [M79.18], Acute strain of neck muscle, initial encounter [S16.1XXA]   PERTINENT HISTORY: none  PRECAUTIONS/RESTRICTIONS: None  SUBJECTIVE: Pt presents to PT with increased neck pain reported after putting on her robe last week and re-aggravating her L upper trap. Has tried to be compliant with HEP.    Pain:  Are you having pain? Yes Pain location: L>R neck pain into L medial scapula and L UT NPRS scale: 7/10 Aggravating factors: rotating to L, neck movements Relieving factors: hot shower does help Pain description: sharp, burning, and aching Stage: Acute Stability: staying the same 24 hour pattern: worse with activity   OBJECTIVE: (objective measures completed at initial evaluation unless otherwise dated)   DIAGNOSTIC FINDINGS:  CT clear   GENERAL OBSERVATION:          Hesitant to move                               SENSATION:           Light touch: Appears intact           PALPATION: Exquisite TTP L UT, periscapular, L Cx spine   Cervical ROM   ROM ROM  06/20/2022 4/16  Flexion 10   Extension 4   Right lateral flexion 5   Left lateral flexion 3   Right rotation 3 20  Left rotation 4 15  Flexion rotation (normal is 30 degrees)     Flexion rotation (normal is 30 degrees)       (Blank rows = not tested, N = WNL, * = concordant pain)   UPPER EXTREMITY MMT:   MMT Right 06/20/2022 Left 06/20/2022  Shoulder flexion WNL Unable to test strength d/t pain  Shoulder abduction (C5)      Shoulder ER      Shoulder IR      Middle trapezius      Lower trapezius      Shoulder extension      Grip strength      Shoulder shrug (C4)      Elbow flexion (C6)      Elbow ext (C7)      Thumb ext (C8)      Finger abd (T1)      Grossly        (  Blank rows = not tested, score listed is out of 5 possible points.  N = WNL, D = diminished, C = clear for gross weakness with myotome testing, * = concordant pain with testing)   PATIENT SURVEYS:  NDI 37/50         PATIENT EDUCATION:  POC, diagnosis, prognosis, HEP, and outcome measures.  Pt educated via explanation, demonstration, and handout (HEP).  Pt confirms understanding verbally.             HOME EXERCISE PROGRAM: None issued, encouraged heat   Treatment priorities     Eval (06/20/2022)              TDN              Manual for ROM                                                             TREATMENT: [facility] Adult PT Treatment:                                                DATE: 08/20/22 Manual Therapy: STM and positional release of L upper trap and L LS Gentle suboccipital release  Therapeutic Exercise: Supine chin tuck 2x10 Seated bilat ER 2x10 YTB Seated scapular retraction x 10  ASSESSMENT:   CLINICAL IMPRESSION:  Pt tolerated treatment fair but was limited secondary to pain. Therapy focused on manual therapy for decreasing pain. Exercises targeted deep neck  flexors and periscapular muscles. Continues to benefit from skilled PT, will continue per POC.    OBJECTIVE IMPAIRMENTS: Pain, neck ROM, UE strength limited d/t pain   ACTIVITY LIMITATIONS: lifting, caring for child, work, housework   PERSONAL FACTORS: See medical history and pertinent history     GOALS:     SHORT TERM GOALS: Target date: 07/18/2022   [name] will be >75% HEP compliant to improve carryover between sessions and facilitate independent management of condition   Evaluation (06/20/2022): ongoing Goal status: MET     LONG TERM GOALS: Target date: 09/17/2022     [name] will show a >/= 17 pt improvement in their NDI score (MCID 7.5 pts) as a proxy for functional improvement   Evaluation/Baseline (06/20/2022): 37/50 pts Goal status: INITIAL     2.  [name] will self report >/= 50% decrease in pain from evaluation    Evaluation/Baseline (06/20/2022): 10/10 max pain Goal status: INITIAL     3.  [name] will be able to care for child including lifting and holding for prolonged perios, not limited by pain   Evaluation/Baseline (06/20/2022): limited Goal status: INITIAL     4.  [name] will be able to return to work, not limited by pain   Evaluation/Baseline (06/20/2022): limited Goal status: INITIAL     5.  [name] will improve the following MMTs to >/= 4/5 to show improvement in strength:  shoulder flexion    Evaluation/Baseline (06/20/2022): unable to complete d/t pain Goal status: INITIAL       PLAN: PT FREQUENCY: 1-2x/week   PT DURATION: 8 weeks (Ending 08/15/2022)   PLANNED INTERVENTIONS: Therapeutic exercises, Aquatic therapy, Therapeutic activity, Neuro Muscular re-education, Gait training, Patient/Family  education, Joint mobilization, Dry Needling, Electrical stimulation, Spinal mobilization and/or manipulation, Moist heat, Taping, Vasopneumatic device, Ionotophoresis 4mg /ml Dexamethasone, and Manual therapy   Eloy End PT 08/20/2022, 3:29 PM

## 2022-08-22 ENCOUNTER — Ambulatory Visit: Admitting: Physical Therapy

## 2022-08-22 ENCOUNTER — Encounter: Payer: Self-pay | Admitting: Physical Therapy

## 2022-08-22 DIAGNOSIS — M6281 Muscle weakness (generalized): Secondary | ICD-10-CM

## 2022-08-22 DIAGNOSIS — M542 Cervicalgia: Secondary | ICD-10-CM | POA: Diagnosis not present

## 2022-08-22 NOTE — Therapy (Signed)
OUTPATIENT PHYSICAL THERAPY TREATMENT NOTE   Patient Name: Marisa Haas MRN: [id number] DOB:1980/12/06, 42 y.o., female Today's Date: 08/22/2022  PCP: [facility] REFERRING PROVIDER: Juanda Chance, NP   PT End of Session - 08/22/22 1100     Visit Number 10    Date for PT Re-Evaluation 09/17/22    Authorization Type [employer] MCD - NDI    PT Start Time 1100   pt arrived late   PT Stop Time 1130    PT Time Calculation (min) 30 min    Activity Tolerance Patient limited by pain    Behavior During Therapy [facility] for tasks assessed/performed;Anxious                  Past Medical History:  Diagnosis Date   Hypothyroidism    Past Surgical History:  Procedure Laterality Date   NO PAST SURGERIES     Patient Active Problem List   Diagnosis Date Noted   Normal labor 08/30/2021   SVD (spontaneous vaginal delivery) 08/30/2021    THERAPY DIAG:  Cervicalgia  Muscle weakness   Rationale for Evaluation and Treatment Rehabilitation  REFERRING DIAG: Cervicalgia [M54.2], Acute pain of left shoulder [M25.512], Myofascial pain syndrome [M79.18], Acute strain of neck muscle, initial encounter [S16.1XXA]   PERTINENT HISTORY: none  PRECAUTIONS/RESTRICTIONS: None  SUBJECTIVE:   Pt reports that L UT continues to be quite tight.  She wonders if dry needing might be helpful.  Pain:  Are you having pain? Yes Pain location: L>R neck pain into L medial scapula and L UT NPRS scale: 7/10 Aggravating factors: rotating to L, neck movements Relieving factors: hot shower does help Pain description: sharp, burning, and aching Stage: Acute Stability: staying the same 24 hour pattern: worse with activity   OBJECTIVE: (objective measures completed at initial evaluation unless otherwise dated)   DIAGNOSTIC FINDINGS:  CT clear   GENERAL OBSERVATION:          Hesitant to move                               SENSATION:          Light touch: Appears intact            PALPATION: Exquisite TTP L UT, periscapular, L Cx spine   Cervical ROM   ROM ROM  06/20/2022 4/16  Flexion 10   Extension 4   Right lateral flexion 5   Left lateral flexion 3   Right rotation 3 20  Left rotation 4 15  Flexion rotation (normal is 30 degrees)     Flexion rotation (normal is 30 degrees)       (Blank rows = not tested, N = WNL, * = concordant pain)   UPPER EXTREMITY MMT:   MMT Right 06/20/2022 Left 06/20/2022  Shoulder flexion WNL Unable to test strength d/t pain  Shoulder abduction (C5)      Shoulder ER      Shoulder IR      Middle trapezius      Lower trapezius      Shoulder extension      Grip strength      Shoulder shrug (C4)      Elbow flexion (C6)      Elbow ext (C7)      Thumb ext (C8)      Finger abd (T1)      Grossly        (Blank rows =  not tested, score listed is out of 5 possible points.  N = WNL, D = diminished, C = clear for gross weakness with myotome testing, * = concordant pain with testing)   PATIENT SURVEYS:  NDI 37/50         PATIENT EDUCATION:  POC, diagnosis, prognosis, HEP, and outcome measures.  Pt educated via explanation, demonstration, and handout (HEP).  Pt confirms understanding verbally.             HOME EXERCISE PROGRAM: None issued, encouraged heat   Treatment priorities     Eval (06/20/2022)              TDN              Manual for ROM                                                             TREATMENT: [facility] Adult PT Treatment:                                                DATE: 08/22/22 Manual Therapy: STM and positional release of L upper trap and L LS Gentle suboccipital release  Skilled palpation to identify trigger points for TDN STM to all listed muscles following TDN  Trigger Point Dry-Needling  Treatment instructions: Expect mild to moderate muscle soreness. S/S of pneumothorax if dry needled over a lung field, and to seek immediate medical attention should they occur. Patient verbalized  understanding of these instructions and education.  Patient Consent Given: Yes Education handout provided: No Muscles treated: L UT Electrical stimulation performed: No Parameters: N/A Treatment response/outcome: reduction in muscle tone   Therapeutic Exercise (not completed today: Supine chin tuck 2x10 Seated bilat ER 2x10 YTB Seated scapular retraction x 10  ASSESSMENT:   CLINICAL IMPRESSION:  Pt tolerated treatment fair but was limited secondary to pain. Therapy focused on manual therapy for decreasing pain. Pt reports improvement in pain following manual.  Visit truncated d/t pt late arrival.  Will resume therex next visit.   OBJECTIVE IMPAIRMENTS: Pain, neck ROM, UE strength limited d/t pain   ACTIVITY LIMITATIONS: lifting, caring for child, work, housework   PERSONAL FACTORS: See medical history and pertinent history     GOALS:     SHORT TERM GOALS: Target date: 07/18/2022   Alaira will be >75% HEP compliant to improve carryover between sessions and facilitate independent management of condition   Evaluation (06/20/2022): ongoing Goal status: MET     LONG TERM GOALS: Target date: 09/17/2022     Anija will show a >/= 17 pt improvement in their NDI score (MCID 7.5 pts) as a proxy for functional improvement   Evaluation/Baseline (06/20/2022): 37/50 pts Goal status: INITIAL     2.  Jamilette will self report >/= 50% decrease in pain from evaluation    Evaluation/Baseline (06/20/2022): 10/10 max pain Goal status: INITIAL     3.  Kymorah will be able to care for child including lifting and holding for prolonged perios, not limited by pain   Evaluation/Baseline (06/20/2022): limited Goal status: INITIAL     4.  Trameka will be able to return to  work, not limited by pain   Evaluation/Baseline (06/20/2022): limited Goal status: INITIAL     5.  Rocsi will improve the following MMTs to >/= 4/5 to show improvement in strength:  shoulder flexion     Evaluation/Baseline (06/20/2022): unable to complete d/t pain Goal status: INITIAL       PLAN: PT FREQUENCY: 1-2x/week   PT DURATION: 8 weeks   PLANNED INTERVENTIONS: Therapeutic exercises, Aquatic therapy, Therapeutic activity, Neuro Muscular re-education, Gait training, Patient/Family education, Joint mobilization, Dry Needling, Electrical stimulation, Spinal mobilization and/or manipulation, Moist heat, Taping, Vasopneumatic device, Ionotophoresis 4mg /ml Dexamethasone, and Manual therapy   Kimberlee Nearing Reality Dejonge PT 08/22/2022, 11:38 AM

## 2022-08-27 ENCOUNTER — Ambulatory Visit: Admitting: Physical Therapy

## 2022-08-29 ENCOUNTER — Ambulatory Visit: Admitting: Physical Therapy

## 2022-08-29 ENCOUNTER — Encounter: Payer: Self-pay | Admitting: Physical Therapy

## 2022-08-29 DIAGNOSIS — M542 Cervicalgia: Secondary | ICD-10-CM

## 2022-08-29 DIAGNOSIS — M6281 Muscle weakness (generalized): Secondary | ICD-10-CM

## 2022-08-29 NOTE — Therapy (Signed)
OUTPATIENT PHYSICAL THERAPY TREATMENT NOTE   Patient Name: Marisa Haas MRN: [id number] DOB:1981-02-07, 42 y.o., female Today's Date: 08/29/2022  PCP: [facility] REFERRING PROVIDER: Juanda Chance, NP   PT End of Session - 08/29/22 1219     Visit Number 11    Date for PT Re-Evaluation 09/17/22    Authorization Type [employer] MCD - NDI    PT Start Time 1218    PT Stop Time 1258    PT Time Calculation (min) 40 min    Activity Tolerance Patient limited by pain    Behavior During Therapy [facility] for tasks assessed/performed;Anxious                  Past Medical History:  Diagnosis Date   Hypothyroidism    Past Surgical History:  Procedure Laterality Date   NO PAST SURGERIES     Patient Active Problem List   Diagnosis Date Noted   Normal labor 08/30/2021   SVD (spontaneous vaginal delivery) 08/30/2021    THERAPY DIAG:  Cervicalgia  Muscle weakness   Rationale for Evaluation and Treatment Rehabilitation  REFERRING DIAG: Cervicalgia [M54.2], Acute pain of left shoulder [M25.512], Myofascial pain syndrome [M79.18], Acute strain of neck muscle, initial encounter [S16.1XXA]   PERTINENT HISTORY: none  PRECAUTIONS/RESTRICTIONS: None  SUBJECTIVE:   Pt states that she feels that TDN may have had a more lasting beneficial effect than stretching alone.  Pain:  Are you having pain? Yes Pain location: L>R neck pain into L medial scapula and L UT NPRS scale: 7/10 Aggravating factors: rotating to L, neck movements Relieving factors: hot shower does help Pain description: sharp, burning, and aching Stage: Acute Stability: staying the same 24 hour pattern: worse with activity   OBJECTIVE: (objective measures completed at initial evaluation unless otherwise dated)   DIAGNOSTIC FINDINGS:  CT clear   GENERAL OBSERVATION:          Hesitant to move                               SENSATION:          Light touch: Appears intact            PALPATION: Exquisite TTP L UT, periscapular, L Cx spine   Cervical ROM   ROM ROM  06/20/2022 4/16 5/30  Flexion 10    Extension 4    Right lateral flexion 5    Left lateral flexion 3    Right rotation 3 20 30   Left rotation 4 15 25   Flexion rotation (normal is 30 degrees)      Flexion rotation (normal is 30 degrees)        (Blank rows = not tested, N = WNL, * = concordant pain)   UPPER EXTREMITY MMT:   MMT Right 06/20/2022 Left 06/20/2022  Shoulder flexion WNL Unable to test strength d/t pain  Shoulder abduction (C5)      Shoulder ER      Shoulder IR      Middle trapezius      Lower trapezius      Shoulder extension      Grip strength      Shoulder shrug (C4)      Elbow flexion (C6)      Elbow ext (C7)      Thumb ext (C8)      Finger abd (T1)      Grossly        (  Blank rows = not tested, score listed is out of 5 possible points.  N = WNL, D = diminished, C = clear for gross weakness with myotome testing, * = concordant pain with testing)   PATIENT SURVEYS:  NDI 37/50         PATIENT EDUCATION:  POC, diagnosis, prognosis, HEP, and outcome measures.  Pt educated via explanation, demonstration, and handout (HEP).  Pt confirms understanding verbally.             HOME EXERCISE PROGRAM: None issued, encouraged heat   Treatment priorities     Eval (06/20/2022)              TDN              Manual for ROM                                                             TREATMENT:  [facility] Adult PT Treatment:                                                DATE: 08/29/22 Therex: Bil shoulder ext isometrics - 5x to tolerance Contract relax cervical rotation AROM rotation in supine  Manual Therapy: STM and positional release of L upper trap and L LS Gentle suboccipital release  Skilled palpation to identify trigger points for TDN STM to all listed muscles following TDN  Trigger Point Dry-Needling  Treatment instructions: Expect mild to moderate muscle soreness. S/S  of pneumothorax if dry needled over a lung field, and to seek immediate medical attention should they occur. Patient verbalized understanding of these instructions and education.  Patient Consent Given: Yes Education handout provided: No Muscles treated: L UT Electrical stimulation performed: No Parameters: N/A Treatment response/outcome: reduction in muscle tone  [facility] Adult PT Treatment:                                                DATE: 08/22/22 Manual Therapy: STM and positional release of L upper trap and L LS Gentle suboccipital release  Skilled palpation to identify trigger points for TDN STM to all listed muscles following TDN  Trigger Point Dry-Needling  Treatment instructions: Expect mild to moderate muscle soreness. S/S of pneumothorax if dry needled over a lung field, and to seek immediate medical attention should they occur. Patient verbalized understanding of these instructions and education.  Patient Consent Given: Yes Education handout provided: No Muscles treated: L UT Electrical stimulation performed: No Parameters: N/A Treatment response/outcome: reduction in muscle tone   Therapeutic Exercise (not completed today: Supine chin tuck 2x10 Seated bilat ER 2x10 YTB Seated scapular retraction x 10  ASSESSMENT:   CLINICAL IMPRESSION:  Pt tolerated treatment fair but was limited secondary to pain.  Pt request TDN again today as this gave her ~2 days of pain reduction last session.  I encouraged her to work on AROM at home, as she still remains quite limited in rotation; she agrees to plan.  She reports significant pain reduction following therapy.  OBJECTIVE IMPAIRMENTS: Pain, neck ROM, UE strength limited d/t pain   ACTIVITY LIMITATIONS: lifting, caring for child, work, housework   PERSONAL FACTORS: See medical history and pertinent history     GOALS:     SHORT TERM GOALS: Target date: 07/18/2022   Heathermarie will be >75% HEP compliant to improve carryover  between sessions and facilitate independent management of condition   Evaluation (06/20/2022): ongoing Goal status: MET     LONG TERM GOALS: Target date: 09/17/2022     Khadisha will show a >/= 17 pt improvement in their NDI score (MCID 7.5 pts) as a proxy for functional improvement   Evaluation/Baseline (06/20/2022): 37/50 pts Goal status: INITIAL     2.  Itati will self report >/= 50% decrease in pain from evaluation    Evaluation/Baseline (06/20/2022): 10/10 max pain Goal status: INITIAL     3.  Nyasha will be able to care for child including lifting and holding for prolonged perios, not limited by pain   Evaluation/Baseline (06/20/2022): limited Goal status: INITIAL     4.  Alcie will be able to return to work, not limited by pain   Evaluation/Baseline (06/20/2022): limited Goal status: INITIAL     5.  Gionna will improve the following MMTs to >/= 4/5 to show improvement in strength:  shoulder flexion    Evaluation/Baseline (06/20/2022): unable to complete d/t pain Goal status: INITIAL       PLAN: PT FREQUENCY: 1-2x/week   PT DURATION: 8 weeks   PLANNED INTERVENTIONS: Therapeutic exercises, Aquatic therapy, Therapeutic activity, Neuro Muscular re-education, Gait training, Patient/Family education, Joint mobilization, Dry Needling, Electrical stimulation, Spinal mobilization and/or manipulation, Moist heat, Taping, Vasopneumatic device, Ionotophoresis 4mg /ml Dexamethasone, and Manual therapy   Kimberlee Nearing Shaneta Cervenka PT 08/29/2022, 1:10 PM

## 2022-09-03 ENCOUNTER — Ambulatory Visit: Admitting: Physical Therapy

## 2022-09-05 ENCOUNTER — Ambulatory Visit: Attending: Physical Medicine and Rehabilitation | Admitting: Physical Therapy

## 2022-09-05 ENCOUNTER — Encounter: Payer: Self-pay | Admitting: Physical Therapy

## 2022-09-05 DIAGNOSIS — M6281 Muscle weakness (generalized): Secondary | ICD-10-CM | POA: Insufficient documentation

## 2022-09-05 DIAGNOSIS — M542 Cervicalgia: Secondary | ICD-10-CM | POA: Insufficient documentation

## 2022-09-05 NOTE — Therapy (Signed)
OUTPATIENT PHYSICAL THERAPY TREATMENT NOTE   Patient Name: Marisa Haas MRN: [id number] DOB:1980-12-09, 42 y.o., female Today's Date: 09/05/2022  PCP: [facility] REFERRING PROVIDER: Juanda Chance, NP   PT End of Session - 09/05/22 1219     Visit Number 12    Date for PT Re-Evaluation 09/17/22    Authorization Type [employer] MCD - NDI    PT Start Time 1218    PT Stop Time 1259    PT Time Calculation (min) 41 min    Activity Tolerance Patient limited by pain    Behavior During Therapy [facility] for tasks assessed/performed;Anxious                  Past Medical History:  Diagnosis Date   Hypothyroidism    Past Surgical History:  Procedure Laterality Date   NO PAST SURGERIES     Patient Active Problem List   Diagnosis Date Noted   Normal labor 08/30/2021   SVD (spontaneous vaginal delivery) 08/30/2021    THERAPY DIAG:  Cervicalgia  Muscle weakness   Rationale for Evaluation and Treatment Rehabilitation  REFERRING DIAG: Cervicalgia [M54.2], Acute pain of left shoulder [M25.512], Myofascial pain syndrome [M79.18], Acute strain of neck muscle, initial encounter [S16.1XXA]   PERTINENT HISTORY: none  PRECAUTIONS/RESTRICTIONS: None  SUBJECTIVE:   Pt report she has had a plateau in her progress.  She is somewhat discouraged.  Pain:  Are you having pain? Yes Pain location: L>R neck pain into L medial scapula and L UT NPRS scale: 7/10 Aggravating factors: rotating to L, neck movements Relieving factors: hot shower does help Pain description: sharp, burning, and aching Stage: Acute Stability: staying the same 24 hour pattern: worse with activity   OBJECTIVE: (objective measures completed at initial evaluation unless otherwise dated)   DIAGNOSTIC FINDINGS:  CT clear   GENERAL OBSERVATION:          Hesitant to move                               SENSATION:          Light touch: Appears intact           PALPATION: Exquisite TTP L UT,  periscapular, L Cx spine   Cervical ROM   ROM ROM  06/20/2022 4/16 5/30 6/6  Flexion 10   15  Extension 4   30  Right lateral flexion 5   20  Left lateral flexion 3   20  Right rotation 3 20 30  35  Left rotation 4 15 25 30   Flexion rotation (normal is 30 degrees)       Flexion rotation (normal is 30 degrees)         (Blank rows = not tested, N = WNL, * = concordant pain)   UPPER EXTREMITY MMT:   MMT Right 06/20/2022 Left 06/20/2022  Shoulder flexion WNL Unable to test strength d/t pain  Shoulder abduction (C5)      Shoulder ER      Shoulder IR      Middle trapezius      Lower trapezius      Shoulder extension      Grip strength      Shoulder shrug (C4)      Elbow flexion (C6)      Elbow ext (C7)      Thumb ext (C8)      Finger abd (T1)  Grossly        (Blank rows = not tested, score listed is out of 5 possible points.  N = WNL, D = diminished, C = clear for gross weakness with myotome testing, * = concordant pain with testing)   PATIENT SURVEYS:  NDI 37/50         PATIENT EDUCATION:  POC, diagnosis, prognosis, HEP, and outcome measures.  Pt educated via explanation, demonstration, and handout (HEP).  Pt confirms understanding verbally.             HOME EXERCISE PROGRAM: None issued, encouraged heat   Treatment priorities     Eval (06/20/2022)              TDN              Manual for ROM                                                             TREATMENT:  [facility] Adult PT Treatment:                                                DATE: 08/29/22 Therex: Rows with GTB then YTB (high levels of pain) Scapular retraction (D/C d/t pain) Wall slide with towel Shoulder rolls (d/c d/t pain) Cervical AROM  Manual Therapy: STM L UT and scalenes   [facility] Adult PT Treatment:                                                DATE: 08/22/22 Manual Therapy: STM and positional release of L upper trap and L LS Gentle suboccipital release  Skilled palpation to  identify trigger points for TDN STM to all listed muscles following TDN  Trigger Point Dry-Needling  Treatment instructions: Expect mild to moderate muscle soreness. S/S of pneumothorax if dry needled over a lung field, and to seek immediate medical attention should they occur. Patient verbalized understanding of these instructions and education.  Patient Consent Given: Yes Education handout provided: No Muscles treated: L UT Electrical stimulation performed: No Parameters: N/A Treatment response/outcome: reduction in muscle tone   Therapeutic Exercise (not completed today: Supine chin tuck 2x10 Seated bilat ER 2x10 YTB Seated scapular retraction x 10  ASSESSMENT:   CLINICAL IMPRESSION:  Ebony tolerated session fair with no adverse reaction.  Alexes as made some progress with ROM and pain since starting therapy, but remains quite limited in functional cervical ROM and tasks involving the L shoulder.  Manual therapy has provided temporary pain relief, but the changes are not durable.  We will try to increase the amount of exercise vs manual and see if we can get more functional improvement.     OBJECTIVE IMPAIRMENTS: Pain, neck ROM, UE strength limited d/t pain   ACTIVITY LIMITATIONS: lifting, caring for child, work, housework   PERSONAL FACTORS: See medical history and pertinent history     GOALS:     SHORT TERM GOALS: Target date: 07/18/2022   Madicyn will be >75% HEP compliant to improve  carryover between sessions and facilitate independent management of condition   Evaluation (06/20/2022): ongoing Goal status: MET     LONG TERM GOALS: Target date: 09/17/2022     Elaini will show a >/= 17 pt improvement in their NDI score (MCID 7.5 pts) as a proxy for functional improvement   Evaluation/Baseline (06/20/2022): 37/50 pts 6/6: Neck Disability Index: 24 / 50 = 48.0 % Goal status: progressing      2.  Rolonda will self report >/= 50% decrease in pain from evaluation     Evaluation/Baseline (06/20/2022): 10/10 max pain 6/6: 5/10, 30% Goal status: ongoing     3.  Shureka will be able to care for child including lifting and holding for prolonged perios, not limited by pain   Evaluation/Baseline (06/20/2022): limited 6/6: improved and able to complete basic tasks but with significant pain Goal status: INITIAL     4.  Caliann will be able to return to work, not limited by pain   Evaluation/Baseline (06/20/2022): limited Goal status: INITIAL     5.  Chaunta will improve the following MMTs to >/= 4/5 to show improvement in strength:  shoulder flexion    Evaluation/Baseline (06/20/2022): unable to complete d/t pain 6/6: 3/5 with pain Goal status: ongoing       PLAN: PT FREQUENCY: 1-2x/week   PT DURATION: 8 weeks   PLANNED INTERVENTIONS: Therapeutic exercises, Aquatic therapy, Therapeutic activity, Neuro Muscular re-education, Gait training, Patient/Family education, Joint mobilization, Dry Needling, Electrical stimulation, Spinal mobilization and/or manipulation, Moist heat, Taping, Vasopneumatic device, Ionotophoresis 4mg /ml Dexamethasone, and Manual therapy   Kimberlee Nearing Silver Parkey PT 09/05/2022, 3:36 PM

## 2022-09-10 ENCOUNTER — Ambulatory Visit: Admitting: Physical Therapy

## 2022-09-11 ENCOUNTER — Ambulatory Visit: Admitting: Physical Therapy

## 2022-09-11 ENCOUNTER — Encounter: Payer: Self-pay | Admitting: Physical Therapy

## 2022-09-11 DIAGNOSIS — M542 Cervicalgia: Secondary | ICD-10-CM | POA: Diagnosis not present

## 2022-09-11 DIAGNOSIS — M6281 Muscle weakness (generalized): Secondary | ICD-10-CM

## 2022-09-11 NOTE — Therapy (Signed)
OUTPATIENT PHYSICAL THERAPY TREATMENT NOTE   Patient Name: Marisa Haas MRN: [id number] DOB:04-16-1980, 42 y.o., female Today's Date: 09/11/2022  PCP: [facility] REFERRING PROVIDER: Juanda Chance, NP   PT End of Session - 09/11/22 1022     Visit Number 13    Date for PT Re-Evaluation 09/17/22    Authorization Type [employer] MCD - NDI    PT Start Time 1022    PT Stop Time 1045    PT Time Calculation (min) 23 min    Activity Tolerance Patient limited by pain    Behavior During Therapy [facility] for tasks assessed/performed;Anxious                   Past Medical History:  Diagnosis Date   Hypothyroidism    Past Surgical History:  Procedure Laterality Date   NO PAST SURGERIES     Patient Active Problem List   Diagnosis Date Noted   Normal labor 08/30/2021   SVD (spontaneous vaginal delivery) 08/30/2021    THERAPY DIAG:  Cervicalgia  Muscle weakness   Rationale for Evaluation and Treatment Rehabilitation  REFERRING DIAG: Cervicalgia [M54.2], Acute pain of left shoulder [M25.512], Myofascial pain syndrome [M79.18], Acute strain of neck muscle, initial encounter [S16.1XXA]   PERTINENT HISTORY: none  PRECAUTIONS/RESTRICTIONS: None  SUBJECTIVE:   Pt reports that her pain has been about the same.  She has been trying to be a bit more active at home.  Pain:  Are you having pain? Yes Pain location: L>R neck pain into L medial scapula and L UT NPRS scale: 7/10 Aggravating factors: rotating to L, neck movements Relieving factors: hot shower does help Pain description: sharp, burning, and aching Stage: Acute Stability: staying the same 24 hour pattern: worse with activity   OBJECTIVE: (objective measures completed at initial evaluation unless otherwise dated)   DIAGNOSTIC FINDINGS:  CT clear   GENERAL OBSERVATION:          Hesitant to move                               SENSATION:          Light touch: Appears intact            PALPATION: Exquisite TTP L UT, periscapular, L Cx spine   Cervical ROM   ROM ROM  06/20/2022 4/16 5/30 6/6   Flexion 10   15   Extension 4   30   Right lateral flexion 5   20   Left lateral flexion 3   20   Right rotation 3 20 30  35   Left rotation 4 15 25 30    Flexion rotation (normal is 30 degrees)        Flexion rotation (normal is 30 degrees)          (Blank rows = not tested, N = WNL, * = concordant pain)   UPPER EXTREMITY MMT:   MMT Right 06/20/2022 Left 06/20/2022  Shoulder flexion WNL Unable to test strength d/t pain  Shoulder abduction (C5)      Shoulder ER      Shoulder IR      Middle trapezius      Lower trapezius      Shoulder extension      Grip strength      Shoulder shrug (C4)      Elbow flexion (C6)      Elbow ext (C7)  Thumb ext (C8)      Finger abd (T1)      Grossly        (Blank rows = not tested, score listed is out of 5 possible points.  N = WNL, D = diminished, C = clear for gross weakness with myotome testing, * = concordant pain with testing)   PATIENT SURVEYS:  NDI 37/50         PATIENT EDUCATION:  POC, diagnosis, prognosis, HEP, and outcome measures.  Pt educated via explanation, demonstration, and handout (HEP).  Pt confirms understanding verbally.             HOME EXERCISE PROGRAM: None issued, encouraged heat   Treatment priorities     Eval (06/20/2022)              TDN              Manual for ROM                                                             TREATMENT:  [facility] Adult PT Treatment:                                                DATE: 09/11/22 Therex: Cervical AROM with PT OP in supine  Manual Therapy: STM L UT and scalenes   [facility] Adult PT Treatment:                                                DATE: 08/22/22 Manual Therapy: STM and positional release of L upper trap and L LS Gentle suboccipital release  Skilled palpation to identify trigger points for TDN STM to all listed muscles following  TDN  Trigger Point Dry-Needling  Treatment instructions: Expect mild to moderate muscle soreness. S/S of pneumothorax if dry needled over a lung field, and to seek immediate medical attention should they occur. Patient verbalized understanding of these instructions and education.  Patient Consent Given: Yes Education handout provided: No Muscles treated: L UT Electrical stimulation performed: No Parameters: N/A Treatment response/outcome: reduction in muscle tone   Therapeutic Exercise (not completed today: Supine chin tuck 2x10 Seated bilat ER 2x10 YTB Seated scapular retraction x 10  ASSESSMENT:   CLINICAL IMPRESSION:  Tenia tolerated session fair with no adverse reaction.  Pt arrived late so visit was truncated.  Pt better able to tolerate higher pressure during STM today.  Will check goals and assess overall  progress tomorrow.   OBJECTIVE IMPAIRMENTS: Pain, neck ROM, UE strength limited d/t pain   ACTIVITY LIMITATIONS: lifting, caring for child, work, housework   PERSONAL FACTORS: See medical history and pertinent history     GOALS:     SHORT TERM GOALS: Target date: 07/18/2022   Rogan will be >75% HEP compliant to improve carryover between sessions and facilitate independent management of condition   Evaluation (06/20/2022): ongoing Goal status: MET     LONG TERM GOALS: Target date: 09/17/2022     Kalee will show a >/= 17  pt improvement in their NDI score (MCID 7.5 pts) as a proxy for functional improvement   Evaluation/Baseline (06/20/2022): 37/50 pts 6/6: Neck Disability Index: 24 / 50 = 48.0 % Goal status: progressing      2.  Windi will self report >/= 50% decrease in pain from evaluation    Evaluation/Baseline (06/20/2022): 10/10 max pain 6/6: 5/10, 30% Goal status: ongoing     3.  Roise will be able to care for child including lifting and holding for prolonged perios, not limited by pain   Evaluation/Baseline (06/20/2022): limited 6/6: improved  and able to complete basic tasks but with significant pain Goal status: INITIAL     4.  Tyiana will be able to return to work, not limited by pain   Evaluation/Baseline (06/20/2022): limited Goal status: INITIAL     5.  Daylene will improve the following MMTs to >/= 4/5 to show improvement in strength:  shoulder flexion    Evaluation/Baseline (06/20/2022): unable to complete d/t pain 6/6: 3/5 with pain Goal status: ongoing       PLAN: PT FREQUENCY: 1-2x/week   PT DURATION: 8 weeks   PLANNED INTERVENTIONS: Therapeutic exercises, Aquatic therapy, Therapeutic activity, Neuro Muscular re-education, Gait training, Patient/Family education, Joint mobilization, Dry Needling, Electrical stimulation, Spinal mobilization and/or manipulation, Moist heat, Taping, Vasopneumatic device, Ionotophoresis 4mg /ml Dexamethasone, and Manual therapy   Kimberlee Nearing Jolena Kittle PT 09/11/2022, 10:53 AM

## 2022-09-12 ENCOUNTER — Ambulatory Visit

## 2022-09-12 DIAGNOSIS — M6281 Muscle weakness (generalized): Secondary | ICD-10-CM

## 2022-09-12 DIAGNOSIS — M542 Cervicalgia: Secondary | ICD-10-CM | POA: Diagnosis not present

## 2022-09-12 NOTE — Therapy (Signed)
OUTPATIENT PHYSICAL THERAPY TREATMENT NOTE   Patient Name: Marisa Haas MRN: [id number] DOB:[birth year], 42 y.o., female Today's Date: 09/12/2022  PCP: [facility] REFERRING PROVIDER: Juanda Chance, NP   PT End of Session - 09/12/22 1046     Visit Number 14    Date for PT Re-Evaluation 09/17/22    Authorization Type [employer] MCD - NDI    PT Start Time 1046    PT Stop Time 1124    PT Time Calculation (min) 38 min    Activity Tolerance Patient limited by pain    Behavior During Therapy [facility] for tasks assessed/performed;Anxious              Past Medical History:  Diagnosis Date   Hypothyroidism    Past Surgical History:  Procedure Laterality Date   NO PAST SURGERIES     Patient Active Problem List   Diagnosis Date Noted   Normal labor 08/30/2021   SVD (spontaneous vaginal delivery) 08/30/2021    THERAPY DIAG:  Cervicalgia  Muscle weakness   Rationale for Evaluation and Treatment Rehabilitation  REFERRING DIAG: Cervicalgia [M54.2], Acute pain of left shoulder [M25.512], Myofascial pain syndrome [M79.18], Acute strain of neck muscle, initial encounter [S16.1XXA]   PERTINENT HISTORY: None  PRECAUTIONS/RESTRICTIONS: None  SUBJECTIVE:  Pt presents to PT with reports of slight decrease in pain after yesterday's appointment. Has tried to be compliant with HEP with no adverse effect.   Pain:  Are you having pain? Yes Pain location: L>R neck pain into L medial scapula and L UT NPRS scale: 7/10 Aggravating factors: rotating to L, neck movements Relieving factors: hot shower does help Pain description: sharp, burning, and aching Stage: Acute Stability: staying the same 24 hour pattern: worse with activity   OBJECTIVE: (objective measures completed at initial evaluation unless otherwise dated)   DIAGNOSTIC FINDINGS:  CT clear   GENERAL OBSERVATION:          Hesitant to move                               SENSATION:          Light touch: Appears  intact           PALPATION: Exquisite TTP L UT, periscapular, L Cx spine   Cervical ROM   ROM ROM  06/20/2022 4/16 5/30 6/6   Flexion 10   15   Extension 4   30   Right lateral flexion 5   20   Left lateral flexion 3   20   Right rotation 3 20 30  35   Left rotation 4 15 25 30    Flexion rotation (normal is 30 degrees)        Flexion rotation (normal is 30 degrees)          (Blank rows = not tested, N = WNL, * = concordant pain)    (Blank rows = not tested, score listed is out of 5 possible points.  N = WNL, D = diminished, C = clear for gross weakness with myotome testing, * = concordant pain with testing)   PATIENT SURVEYS:  NDI 37/50         PATIENT EDUCATION:  POC, diagnosis, prognosis, HEP, and outcome measures.  Pt educated via explanation, demonstration, and handout (HEP).  Pt confirms understanding verbally.             HOME EXERCISE PROGRAM: None issued, encouraged heat  Treatment priorities     Eval (06/20/2022)              TDN              Manual for ROM                                                             TREATMENT: [facility] Adult PT Treatment:                                                DATE: 09/12/22 Manual Therapy: STM L UT and scalenes Positional release to L upper trap Suboccipital release  [facility] Adult PT Treatment:                                                DATE: 09/11/22 Therex: Cervical AROM with PT OP in supine Manual Therapy: STM L UT and scalenes  [facility] Adult PT Treatment:                                                DATE: 08/22/22 Manual Therapy: STM and positional release of L upper trap and L LS Gentle suboccipital release  Skilled palpation to identify trigger points for TDN STM to all listed muscles following TDN Trigger Point Dry-Needling:  Treatment instructions: Expect mild to moderate muscle soreness. S/S of pneumothorax if dry needled over a lung field, and to seek immediate medical attention should they occur.  Patient verbalized understanding of these instructions and education.  Patient Consent Given: Yes Education handout provided: No Muscles treated: L UT Electrical stimulation performed: No Parameters: N/A Treatment response/outcome: reduction in muscle tone Therapeutic Exercise (not completed today:) Supine chin tuck 2x10 Seated bilat ER 2x10 YTB Seated scapular retraction x 10  ASSESSMENT:   CLINICAL IMPRESSION:  Pt tolerated manual therapy well noting decrease to 2/10 pain post session. Over the course of PT treatment she has made some slight improvement in progress, as noted by goals checked on 09/05/22. After discussion with pt, she feels she still needs PT and will change POC frequency to decrease down to 1x/wk. Pt in agreement with current plan, will continue as tolerated.    OBJECTIVE IMPAIRMENTS: Pain, neck ROM, UE strength limited d/t pain   ACTIVITY LIMITATIONS: lifting, caring for child, work, housework   PERSONAL FACTORS: See medical history and pertinent history     GOALS:     SHORT TERM GOALS: Target date: 07/18/2022   Estephanie will be >75% HEP compliant to improve carryover between sessions and facilitate independent management of condition   Evaluation (06/20/2022): ongoing Goal status: MET     LONG TERM GOALS: Target date: 10/17/2022      Maylan will show a >/= 17 pt improvement in their NDI score (MCID 7.5 pts) as a proxy for functional improvement   Evaluation/Baseline (06/20/2022): 37/50 pts 6/6: Neck Disability Index: 24 / 50 =  48.0 % Goal status: progressing      2.  Tinea will self report >/= 50% decrease in pain from evaluation    Evaluation/Baseline (06/20/2022): 10/10 max pain 6/6: 5/10, 30% Goal status: ongoing     3.  Zamariyah will be able to care for child including lifting and holding for prolonged perios, not limited by pain   Evaluation/Baseline (06/20/2022): limited 6/6: improved and able to complete basic tasks but with significant  pain Goal status: INITIAL     4.  Keeleigh will be able to return to work, not limited by pain   Evaluation/Baseline (06/20/2022): limited Goal status: INITIAL     5.  Rajanae will improve the following MMTs to >/= 4/5 to show improvement in strength:  shoulder flexion    Evaluation/Baseline (06/20/2022): unable to complete d/t pain 6/6: 3/5 with pain Goal status: ongoing       PLAN: PT FREQUENCY: 1x/week   PT DURATION: 5 weeks   PLANNED INTERVENTIONS: Therapeutic exercises, Aquatic therapy, Therapeutic activity, Neuro Muscular re-education, Gait training, Patient/Family education, Joint mobilization, Dry Needling, Electrical stimulation, Spinal mobilization and/or manipulation, Moist heat, Taping, Vasopneumatic device, Ionotophoresis 4mg /ml Dexamethasone, and Manual therapy   Eloy End PT 09/12/2022, 1:46 PM

## 2022-09-26 ENCOUNTER — Ambulatory Visit

## 2022-09-26 DIAGNOSIS — M542 Cervicalgia: Secondary | ICD-10-CM

## 2022-09-26 DIAGNOSIS — M6281 Muscle weakness (generalized): Secondary | ICD-10-CM
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
## 2022-09-30 NOTE — Therapy (Signed)
OUTPATIENT PHYSICAL THERAPY TREATMENT NOTE   Patient Name: Marisa Haas MRN: [id number] DOB:29-Apr-1980, 42 y.o., female Today's Date: 10/01/2022  PCP: [facility] REFERRING PROVIDER: Juanda Chance, NP   PT End of Session - 10/01/22 1348     Visit Number 16    Date for PT Re-Evaluation 10/17/22    Authorization Type [employer] MCD - NDI    PT Start Time 1359    PT Stop Time 1437    PT Time Calculation (min) 38 min    Activity Tolerance Patient limited by pain    Behavior During Therapy [facility] for tasks assessed/performed;Anxious                Past Medical History:  Diagnosis Date   Hypothyroidism    Past Surgical History:  Procedure Laterality Date   NO PAST SURGERIES     Patient Active Problem List   Diagnosis Date Noted   Normal labor 08/30/2021   SVD (spontaneous vaginal delivery) 08/30/2021    THERAPY DIAG:  Cervicalgia  Muscle weakness   Rationale for Evaluation and Treatment Rehabilitation  REFERRING DIAG: Cervicalgia [M54.2], Acute pain of left shoulder [M25.512], Myofascial pain syndrome [M79.18], Acute strain of neck muscle, initial encounter [S16.1XXA]   PERTINENT HISTORY: None  PRECAUTIONS/RESTRICTIONS: None  SUBJECTIVE:  Pt presents to PT with reports of slight decrease in pain after yesterday's appointment. Has tried to be compliant with HEP with no adverse effect.    Pain:  Are you having pain? Yes Pain location: L>R neck pain into L medial scapula and L UT NPRS scale: 4/10 Aggravating factors: rotating to L, neck movements Relieving factors: hot shower does help Pain description: sharp, burning, and aching Stage: Acute Stability: staying the same 24 hour pattern: worse with activity   OBJECTIVE: (objective measures completed at initial evaluation unless otherwise dated)   DIAGNOSTIC FINDINGS:  CT clear   GENERAL OBSERVATION:          Hesitant to move                               SENSATION:          Light touch:  Appears intact           PALPATION: Exquisite TTP L UT, periscapular, L Cx spine   Cervical ROM   ROM ROM  06/20/2022 4/16 5/30 6/6   Flexion 10   15   Extension 4   30   Right lateral flexion 5   20   Left lateral flexion 3   20   Right rotation 3 20 30  35   Left rotation 4 15 25 30    Flexion rotation (normal is 30 degrees)        Flexion rotation (normal is 30 degrees)          (Blank rows = not tested, N = WNL, * = concordant pain)    (Blank rows = not tested, score listed is out of 5 possible points.  N = WNL, D = diminished, C = clear for gross weakness with myotome testing, * = concordant pain with testing)   PATIENT SURVEYS:  NDI 37/50         PATIENT EDUCATION:  POC, diagnosis, prognosis, HEP, and outcome measures.  Pt educated via explanation, demonstration, and handout (HEP).  Pt confirms understanding verbally.             HOME EXERCISE PROGRAM: None issued, encouraged  heat   Treatment priorities     Eval (06/20/2022)              TDN              Manual for ROM                                                             TREATMENT: [facility] Adult PT Treatment:                                                DATE: 10/01/22 Manual Therapy: STM L UT and scalenes Positional release to L upper trap Suboccipital release Therapeutic Exercise: Supine chin tuck 2x10 Seated bilat ER 2x10 RTB Seated horizontal abd 2x10 RTB  [facility] Adult PT Treatment:                                                DATE: 09/26/22 Manual Therapy: STM L UT and scalenes Positional release to L upper trap Suboccipital release  [facility] Adult PT Treatment:                                                DATE: 09/12/22 Manual Therapy: STM L UT and scalenes Positional release to L upper trap Suboccipital release  ASSESSMENT:   CLINICAL IMPRESSION:  Pt tolerated treatment fair but was limited secondary to pain. Therapy focused on manual therapy for decreasing pain. Exercises targeted deep neck  flexors and periscapular muscles. Continues to benefit from skilled PT, will continue per POC.    OBJECTIVE IMPAIRMENTS: Pain, neck ROM, UE strength limited d/t pain   ACTIVITY LIMITATIONS: lifting, caring for child, work, housework   PERSONAL FACTORS: See medical history and pertinent history     GOALS:     SHORT TERM GOALS: Target date: 07/18/2022   Abrielle will be >75% HEP compliant to improve carryover between sessions and facilitate independent management of condition   Evaluation (06/20/2022): ongoing Goal status: MET     LONG TERM GOALS: Target date: 10/17/2022      Voula will show a >/= 17 pt improvement in their NDI score (MCID 7.5 pts) as a proxy for functional improvement   Evaluation/Baseline (06/20/2022): 37/50 pts 6/6: Neck Disability Index: 24 / 50 = 48.0 % Goal status: progressing      2.  Frank will self report >/= 50% decrease in pain from evaluation    Evaluation/Baseline (06/20/2022): 10/10 max pain 6/6: 5/10, 30% Goal status: ongoing     3.  Lener will be able to care for child including lifting and holding for prolonged perios, not limited by pain   Evaluation/Baseline (06/20/2022): limited 6/6: improved and able to complete basic tasks but with significant pain Goal status: INITIAL     4.  Maleiya will be able to return to work, not limited by pain   Evaluation/Baseline (06/20/2022): limited Goal status:  INITIAL     5.  Sinnie will improve the following MMTs to >/= 4/5 to show improvement in strength:  shoulder flexion    Evaluation/Baseline (06/20/2022): unable to complete d/t pain 6/6: 3/5 with pain Goal status: ongoing       PLAN: PT FREQUENCY: 1x/week   PT DURATION: 5 weeks   PLANNED INTERVENTIONS: Therapeutic exercises, Aquatic therapy, Therapeutic activity, Neuro Muscular re-education, Gait training, Patient/Family education, Joint mobilization, Dry Needling, Electrical stimulation, Spinal mobilization and/or manipulation, Moist  heat, Taping, Vasopneumatic device, Ionotophoresis 4mg /ml Dexamethasone, and Manual therapy   Eloy End PT 10/01/2022, 2:37 PM

## 2022-10-01 ENCOUNTER — Ambulatory Visit: Attending: Physical Medicine and Rehabilitation

## 2022-10-01 DIAGNOSIS — M6281 Muscle weakness (generalized): Secondary | ICD-10-CM | POA: Insufficient documentation

## 2022-10-01 DIAGNOSIS — M542 Cervicalgia: Secondary | ICD-10-CM | POA: Diagnosis present

## 2022-10-08 ENCOUNTER — Ambulatory Visit

## 2022-10-08 NOTE — Therapy (Signed)
OUTPATIENT PHYSICAL THERAPY TREATMENT NOTE   Patient Name: Marisa Haas MRN: [id number] DOB:1981-02-11, 42 y.o., female Today's Date: 10/08/2022  PCP: [facility] REFERRING PROVIDER: Juanda Chance, NP        Past Medical History:  Diagnosis Date   Hypothyroidism    Past Surgical History:  Procedure Laterality Date   NO PAST SURGERIES     Patient Active Problem List   Diagnosis Date Noted   Normal labor 08/30/2021   SVD (spontaneous vaginal delivery) 08/30/2021    THERAPY DIAG:  No diagnosis found.   Rationale for Evaluation and Treatment Rehabilitation  REFERRING DIAG: Cervicalgia [M54.2], Acute pain of left shoulder [M25.512], Myofascial pain syndrome [M79.18], Acute strain of neck muscle, initial encounter [S16.1XXA]   PERTINENT HISTORY: None  PRECAUTIONS/RESTRICTIONS: None  SUBJECTIVE:  ***  Pain:  Are you having pain? Yes Pain location: L>R neck pain into L medial scapula and L UT NPRS scale: 4/10 Aggravating factors: rotating to L, neck movements Relieving factors: hot shower does help Pain description: sharp, burning, and aching Stage: Acute Stability: staying the same 24 hour pattern: worse with activity   OBJECTIVE: (objective measures completed at initial evaluation unless otherwise dated)   DIAGNOSTIC FINDINGS:  CT clear   GENERAL OBSERVATION:          Hesitant to move                               SENSATION:          Light touch: Appears intact           PALPATION: Exquisite TTP L UT, periscapular, L Cx spine   Cervical ROM   ROM ROM  06/20/2022 4/16 5/30 6/6   Flexion 10   15   Extension 4   30   Right lateral flexion 5   20   Left lateral flexion 3   20   Right rotation 3 20 30  35   Left rotation 4 15 25 30    Flexion rotation (normal is 30 degrees)        Flexion rotation (normal is 30 degrees)          (Blank rows = not tested, N = WNL, * = concordant pain)    (Blank rows = not tested, score listed is  out of 5 possible points.  N = WNL, D = diminished, C = clear for gross weakness with myotome testing, * = concordant pain with testing)   PATIENT SURVEYS:  NDI 37/50         PATIENT EDUCATION:  POC, diagnosis, prognosis, HEP, and outcome measures.  Pt educated via explanation, demonstration, and handout (HEP).  Pt confirms understanding verbally.             HOME EXERCISE PROGRAM: None issued, encouraged heat   Treatment priorities     Eval (06/20/2022)              TDN              Manual for ROM                                                             TREATMENT: [facility] Adult PT Treatment:  DATE: 10/09/22 Manual Therapy: STM L UT and scalenes Positional release to L upper trap Suboccipital release Therapeutic Exercise: Supine chin tuck 2x10 Seated bilat ER 2x10 RTB Seated horizontal abd 2x10 RTB  [facility] Adult PT Treatment:                                                DATE: 10/01/22 Manual Therapy: STM L UT and scalenes Positional release to L upper trap Suboccipital release Therapeutic Exercise: Supine chin tuck 2x10 Seated bilat ER 2x10 RTB Seated horizontal abd 2x10 RTB  [facility] Adult PT Treatment:                                                DATE: 09/26/22 Manual Therapy: STM L UT and scalenes Positional release to L upper trap Suboccipital release  [facility] Adult PT Treatment:                                                DATE: 09/12/22 Manual Therapy: STM L UT and scalenes Positional release to L upper trap Suboccipital release  ASSESSMENT:   CLINICAL IMPRESSION:  Pt tolerated treatment fair but was limited secondary to pain. Therapy focused on manual therapy for decreasing pain. Exercises targeted deep neck flexors and periscapular muscles. Continues to benefit from skilled PT, will continue per POC.    OBJECTIVE IMPAIRMENTS: Pain, neck ROM, UE strength limited d/t pain   ACTIVITY LIMITATIONS: lifting, caring  for child, work, housework   PERSONAL FACTORS: See medical history and pertinent history     GOALS:     SHORT TERM GOALS: Target date: 07/18/2022   Denelle will be >75% HEP compliant to improve carryover between sessions and facilitate independent management of condition   Evaluation (06/20/2022): ongoing Goal status: MET     LONG TERM GOALS: Target date: 10/17/2022      Addisson will show a >/= 17 pt improvement in their NDI score (MCID 7.5 pts) as a proxy for functional improvement   Evaluation/Baseline (06/20/2022): 37/50 pts 6/6: Neck Disability Index: 24 / 50 = 48.0 % Goal status: progressing      2.  Georganne will self report >/= 50% decrease in pain from evaluation    Evaluation/Baseline (06/20/2022): 10/10 max pain 6/6: 5/10, 30% Goal status: ongoing     3.  Aqsa will be able to care for child including lifting and holding for prolonged perios, not limited by pain   Evaluation/Baseline (06/20/2022): limited 6/6: improved and able to complete basic tasks but with significant pain Goal status: INITIAL     4.  Elyshia will be able to return to work, not limited by pain   Evaluation/Baseline (06/20/2022): limited Goal status: INITIAL     5.  Nicie will improve the following MMTs to >/= 4/5 to show improvement in strength:  shoulder flexion    Evaluation/Baseline (06/20/2022): unable to complete d/t pain 6/6: 3/5 with pain Goal status: ongoing       PLAN: PT FREQUENCY: 1x/week   PT DURATION: 5 weeks   PLANNED INTERVENTIONS: Therapeutic exercises, Aquatic therapy, Therapeutic activity, Neuro  Muscular re-education, Gait training, Patient/Family education, Joint mobilization, Dry Needling, Electrical stimulation, Spinal mobilization and/or manipulation, Moist heat, Taping, Vasopneumatic device, Ionotophoresis 4mg /ml Dexamethasone, and Manual therapy   Eloy End PT 10/08/2022, 8:27 AM

## 2022-10-09 ENCOUNTER — Encounter: Admitting: Physical Therapy

## 2022-10-09 ENCOUNTER — Ambulatory Visit

## 2022-10-09 DIAGNOSIS — M6281 Muscle weakness (generalized): Secondary | ICD-10-CM

## 2022-10-09 DIAGNOSIS — M542 Cervicalgia: Secondary | ICD-10-CM

## 2022-10-16 ENCOUNTER — Ambulatory Visit: Admitting: Physical Therapy

## 2022-10-23 ENCOUNTER — Encounter: Payer: Self-pay | Admitting: Physical Therapy

## 2022-10-23 ENCOUNTER — Ambulatory Visit: Admitting: Physical Therapy

## 2022-10-23 DIAGNOSIS — M542 Cervicalgia: Secondary | ICD-10-CM | POA: Diagnosis not present

## 2022-10-23 DIAGNOSIS — M6281 Muscle weakness (generalized): Secondary | ICD-10-CM

## 2022-10-23 NOTE — Therapy (Signed)
PHYSICAL THERAPY DISCHARGE SUMMARY  Visits from Start of Care: 18  Current functional level related to goals / functional outcomes: See assessment/goals   Remaining deficits: See assessment/goals   Education / Equipment: HEP and D/C plans  Patient agrees to discharge. Patient goals were met. Patient is being discharged due to meeting the stated rehab goals.   Patient Name: Marisa Haas MRN: [id number] DOB:[birth year], 42 y.o., female Today's Date: 10/23/2022  PCP: [facility] REFERRING PROVIDER: Juanda Chance, NP   PT End of Session - 10/23/22 1215     Visit Number 18    Date for PT Re-Evaluation 10/17/22    Authorization Type [employer] MCD - NDI    PT Start Time 1215    PT Stop Time 1256    PT Time Calculation (min) 41 min    Activity Tolerance Patient limited by pain    Behavior During Therapy [facility] for tasks assessed/performed;Anxious                 Past Medical History:  Diagnosis Date   Hypothyroidism    Past Surgical History:  Procedure Laterality Date   NO PAST SURGERIES     Patient Active Problem List   Diagnosis Date Noted   Normal labor 08/30/2021   SVD (spontaneous vaginal delivery) 08/30/2021    THERAPY DIAG:  Cervicalgia - Plan: PT plan of care cert/re-cert  Muscle weakness - Plan: PT plan of care cert/re-cert   Rationale for Evaluation and Treatment Rehabilitation  REFERRING DIAG: Cervicalgia [M54.2], Acute pain of left shoulder [M25.512], Myofascial pain syndrome [M79.18], Acute strain of neck muscle, initial encounter [S16.1XXA]   PERTINENT HISTORY: None  PRECAUTIONS/RESTRICTIONS: None  SUBJECTIVE:  Pt presents to PT with reports of decreased pain over last week. Has been compliant with HEP.   Pain:  Are you having pain? Yes Pain location: L>R neck pain into L medial scapula and L UT NPRS scale: 3/10 Aggravating factors: rotating to L, neck movements Relieving factors: hot shower does help Pain description:  sharp, burning, and aching Stage: Acute Stability: staying the same 24 hour pattern: worse with activity   OBJECTIVE: (objective measures completed at initial evaluation unless otherwise dated)   DIAGNOSTIC FINDINGS:  CT clear   GENERAL OBSERVATION:          Hesitant to move                               SENSATION:          Light touch: Appears intact           PALPATION: Exquisite TTP L UT, periscapular, L Cx spine   Cervical ROM   ROM ROM  06/20/2022 4/16 5/30 6/6   Flexion 10   15   Extension 4   30   Right lateral flexion 5   20   Left lateral flexion 3   20   Right rotation 3 20 30  35   Left rotation 4 15 25 30    Flexion rotation (normal is 30 degrees)        Flexion rotation (normal is 30 degrees)          (Blank rows = not tested, N = WNL, * = concordant pain)    (Blank rows = not tested, score listed is out of 5 possible points.  N = WNL, D = diminished, C = clear for gross weakness with myotome testing, * = concordant pain  with testing)   PATIENT SURVEYS:  NDI 37/50         PATIENT EDUCATION:  POC, diagnosis, prognosis, HEP, and outcome measures.  Pt educated via explanation, demonstration, and handout (HEP).  Pt confirms understanding verbally.             HOME EXERCISE PROGRAM: None issued, encouraged heat   Treatment priorities     Eval (06/20/2022)              TDN              Manual for ROM                                                             TREATMENT:  [facility] Adult PT Treatment:                                                DATE: 10/23/22 Manual Therapy: STM L UT and scalenes Positional release to L upper trap Suboccipital release Manual cervical traction  Therapeutic Activity - collecting information for goals, checking progress, and reviewing with patient  [facility] Adult PT Treatment:                                                DATE: 10/09/22 Manual Therapy: STM L UT and scalenes Positional release to L upper  trap Suboccipital release Manual cervical traction  [facility] Adult PT Treatment:                                                DATE: 10/01/22 Manual Therapy: STM L UT and scalenes Positional release to L upper trap Suboccipital release Therapeutic Exercise: Supine chin tuck 2x10 Seated bilat ER 2x10 RTB Seated horizontal abd 2x10 RTB  [facility] Adult PT Treatment:                                                DATE: 09/26/22 Manual Therapy: STM L UT and scalenes Positional release to L upper trap Suboccipital release  [facility] Adult PT Treatment:                                                DATE: 09/12/22 Manual Therapy: STM L UT and scalenes Positional release to L upper trap Suboccipital release  ASSESSMENT:   CLINICAL IMPRESSION:  Shaquandra Kruger has progressed well with therapy.  Improved impairments include: cervical ROM, cervical ROM, shoulder strength, pain.  Functional improvements include: ability to hold and care for children, complete housework, lift light to moderate weight objects.  Progressions needed include: continued work at home with HEP.  Barriers to  progress include: NA.  Please see GOALS section for progress on short term and long term goals established at evaluation.  I recommend D/C home with HEP; pt agrees with plan.   OBJECTIVE IMPAIRMENTS: Pain, neck ROM, UE strength limited d/t pain   ACTIVITY LIMITATIONS: lifting, caring for child, work, housework   PERSONAL FACTORS: See medical history and pertinent history     GOALS:     SHORT TERM GOALS: Target date: 07/18/2022   Reathel will be >75% HEP compliant to improve carryover between sessions and facilitate independent management of condition   Evaluation (06/20/2022): ongoing Goal status: MET     LONG TERM GOALS: Target date: 10/17/2022      Bijou will show a >/= 17 pt improvement in their NDI score (MCID 7.5 pts) as a proxy for functional improvement   Evaluation/Baseline (06/20/2022): 37/50 pts 6/6: Neck  Disability Index: 24 / 50 = 48.0 % 7/24: 20% Goal status: MET      2.  Aleigha will self report >/= 50% decrease in pain from evaluation    Evaluation/Baseline (06/20/2022): 10/10 max pain 6/6: 5/10, 30% 7/24: 3/10 Goal status: MET     3.  Grenda will be able to care for child including lifting and holding for prolonged perios, not limited by pain   Evaluation/Baseline (06/20/2022): limited 6/6: improved and able to complete basic tasks but with significant pain 7/24: Able to complete with 3/10 pain  Goal status: MET     4.  Armenta will be able to return to work, not limited by pain   Evaluation/Baseline (06/20/2022): limited 7/24: MET Goal status: MET     5.  Shonette will improve the following MMTs to >/= 4/5 to show improvement in strength:  shoulder flexion    Evaluation/Baseline (06/20/2022): unable to complete d/t pain 6/6: 3/5 with pain 7/24: 4/5 Goal status: MET       PLAN: PT FREQUENCY: 1x/week   PT DURATION: 5 weeks   PLANNED INTERVENTIONS: Therapeutic exercises, Aquatic therapy, Therapeutic activity, Neuro Muscular re-education, Gait training, Patient/Family education, Joint mobilization, Dry Needling, Electrical stimulation, Spinal mobilization and/or manipulation, Moist heat, Taping, Vasopneumatic device, Ionotophoresis 4mg /ml Dexamethasone, and Manual therapy   Kimberlee Nearing Montgomery Rothlisberger PT 10/23/2022, 1:01 PM
